# Patient Record
Sex: Male | Born: 1975
Health system: Southern US, Community
[De-identification: ages and names within clinical notes are randomized; demographics above are authoritative.]

---

## 2006-09-10 ENCOUNTER — Ambulatory Visit: Payer: Self-pay | Admitting: Family Medicine

## 2008-10-27 ENCOUNTER — Ambulatory Visit: Payer: Self-pay | Admitting: Family Medicine

## 2008-11-05 ENCOUNTER — Emergency Department (HOSPITAL_COMMUNITY): Admission: EM | Admit: 2008-11-05 | Discharge: 2008-11-05 | Payer: Self-pay | Admitting: Emergency Medicine
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
## 2011-04-07 ENCOUNTER — Ambulatory Visit (INDEPENDENT_AMBULATORY_CARE_PROVIDER_SITE_OTHER): Payer: 59 | Admitting: Medical

## 2011-04-07 ENCOUNTER — Ambulatory Visit: Payer: Self-pay | Admitting: Medical

## 2011-04-07 ENCOUNTER — Encounter: Payer: Self-pay | Admitting: Medical

## 2011-04-07 VITALS — BP 100/80 | HR 60 | Temp 98.2°F | Resp 16 | Wt 180.0 lb

## 2011-04-07 DIAGNOSIS — R05 Cough: Secondary | ICD-10-CM | POA: Insufficient documentation

## 2011-04-07 DIAGNOSIS — R059 Cough, unspecified: Secondary | ICD-10-CM

## 2011-04-07 DIAGNOSIS — R21 Rash and other nonspecific skin eruption: Secondary | ICD-10-CM

## 2011-04-07 MED ORDER — BENZONATATE 200 MG PO CAPS: 200.0000 mg | ORAL_CAPSULE | Freq: Two times a day (BID) | ORAL | Status: AC | PRN

## 2011-04-07 NOTE — Progress Notes (Signed)
Subjective: Here today for 1 wk hx/o nagging cough.  He had a cold, weakness and chills 2 weeks ago, feverish, symptoms improved but then he has his cough has lingered.  He does not feel sick today, mainly just has an ongoing cough.  He is using over-the-counter cough medication without relief.  No other URI symptoms. No recent allergy or reflux symptoms.  He does have a history of mild childhood asthma that resolved.  No other aggravating or relieving factors. No other complaints.  No past medical history on file.  Review of systems: Gen.: No fever, chills, sweats HEENT: No sore throat, ear pain, sinus pressure, runny nose Heart: No chest pain or palpitations Lungs no shortness of breath or wheezing GI: Negative    Objective:   Physical Exam  Filed Vitals:   04/07/11 1031  BP: 100/80  Pulse: 60  Temp: 98.2 F (36.8 C)  Resp: 16    General appearance: alert, no distress, WD/WN, black male  HEENT: normocephalic, sclerae anicteric, TMs pearly, nares patent, no discharge or erythema, pharynx normal Oral cavity: MMM, no lesions Neck: supple, no lymphadenopathy, no thyromegaly, no masses Heart: RRR, normal S1, S2, no murmurs Lungs: CTA bilaterally, no wheezes, rhonchi, or rales Abdomen: +bs, soft, non tender, non distended, no masses, no hepatomegaly, no splenomegaly Pulses: 2+ symmetric, upper and lower extremities, normal cap refill   Assessment and Plan :    Cough - Begin OTC Zyrtec at bedtime 10mg  or Mucinex DM or Robitussin DM OTC if desired. Prescription for Tessalon Perles cough drops you use up to 3 times daily. Drink plenty of water, rest, and if not improving by middle of next week, call back.

## 2011-04-07 NOTE — Patient Instructions (Signed)
Begin OTC Zyrtec at bedtime 10mg  or Mucinex DM or Robitussin DM OTC if desired.   I wrote a prescription for Tessalon Perles cough drops you use up to 3 times daily.   Drink plenty of water, rest, and if not improving by middle of next week, call back.   Cough, Adult  A cough is a reflex that helps clear your throat and airways. It can help heal the body or may be a reaction to an irritated airway. A cough may only last 2 or 3 weeks (acute) or may last more than 8 weeks (chronic).  CAUSES Acute cough:  Viral or bacterial infections.  Chronic cough:  Infections.   Allergies.   Asthma.   Post-nasal drip.   Smoking.   Heartburn or acid reflux.   Some medicines.   Chronic lung problems (COPD).   Cancer.  SYMPTOMS   Cough.   Fever.   Chest pain.   Increased breathing rate.   High-pitched whistling sound when breathing (wheezing).   Colored mucus that you cough up (sputum).  TREATMENT   A bacterial cough may be treated with antibiotic medicine.   A viral cough must run its course and will not respond to antibiotics.   Your caregiver may recommend other treatments if you have a chronic cough.  HOME CARE INSTRUCTIONS   Only take over-the-counter or prescription medicines for pain, discomfort, or fever as directed by your caregiver. Use cough suppressants only as directed by your caregiver.   Use a cold steam vaporizer or humidifier in your bedroom or home to help loosen secretions.   Sleep in a semi-upright position if your cough is worse at night.   Rest as needed.   Stop smoking if you smoke.  SEEK IMMEDIATE MEDICAL CARE IF:   You have pus in your sputum.   Your cough starts to worsen.   You cannot control your cough with suppressants and are losing sleep.   You begin coughing up blood.   You have difficulty breathing.   You develop pain which is getting worse or is uncontrolled with medicine.   You have a fever.  MAKE SURE YOU:   Understand  these instructions.   Will watch your condition.   Will get help right away if you are not doing well or get worse.  Document Released: 09/09/2010 Document Revised: 11/23/2010 Document Reviewed: 09/09/2010 [facility] Patient Information 2012 [place], [place].

## 2011-04-13 ENCOUNTER — Other Ambulatory Visit: Payer: Self-pay | Admitting: Family Medicine

## 2011-09-18 ENCOUNTER — Emergency Department (HOSPITAL_BASED_OUTPATIENT_CLINIC_OR_DEPARTMENT_OTHER)
Admission: EM | Admit: 2011-09-18 | Discharge: 2011-09-18 | Disposition: A | Discharge status: Home / Self Care | Payer: 59 | Attending: Emergency Medicine | Admitting: Emergency Medicine

## 2011-09-18 ENCOUNTER — Encounter (HOSPITAL_BASED_OUTPATIENT_CLINIC_OR_DEPARTMENT_OTHER): Payer: Self-pay | Admitting: Family Medicine

## 2011-09-18 DIAGNOSIS — R197 Diarrhea, unspecified: Secondary | ICD-10-CM | POA: Insufficient documentation

## 2011-09-18 DIAGNOSIS — R002 Palpitations: Secondary | ICD-10-CM | POA: Insufficient documentation

## 2011-09-18 LAB — COMPREHENSIVE METABOLIC PANEL
ALT: 8 U/L (ref 0–53)
AST: 16 U/L (ref 0–37)
Albumin: 4.2 g/dL (ref 3.5–5.2)
CO2: 30 mEq/L (ref 19–32)
Calcium: 10 mg/dL (ref 8.4–10.5)
Creatinine, Ser: 1 mg/dL (ref 0.50–1.35)
GFR calc non Af Amer: >90 mL/min (ref >90)
Sodium: 142 mEq/L (ref 135–145)
Total Protein: 7.7 g/dL (ref 6.0–8.3)

## 2011-09-18 LAB — CBC
MCHC: 35 g/dL (ref 30.0–36.0)
MCV: 87.1 fL (ref 78.0–100.0)
Platelets: 289 10*3/uL (ref 150–400)
RDW: 14.3 % (ref 11.5–15.5)
WBC: 6.1 10*3/uL (ref 4.0–10.5)

## 2011-09-18 LAB — URINALYSIS, ROUTINE W REFLEX MICROSCOPIC
Bilirubin Urine: NEGATIVE
Glucose, UA: NEGATIVE mg/dL
Hgb urine dipstick: NEGATIVE
Specific Gravity, Urine: 1.011 (ref 1.005–1.030)
Urobilinogen, UA: 0.2 mg/dL (ref 0.0–1.0)
pH: 6.5 (ref 5.0–8.0)

## 2011-09-18 LAB — DIFFERENTIAL
Basophils Absolute: 0 10*3/uL (ref 0.0–0.1)
Eosinophils Absolute: 0.1 10*3/uL (ref 0.0–0.7)
Lymphs Abs: 2.5 10*3/uL (ref 0.7–4.0)
Monocytes Relative: 5 % (ref 3–12)
Neutro Abs: 3.2 10*3/uL (ref 1.7–7.7)

## 2011-09-18 MED ORDER — CIPROFLOXACIN HCL 500 MG PO TABS: 500.0000 mg | ORAL_TABLET | Freq: Two times a day (BID) | ORAL | Status: AC

## 2011-09-18 MED ORDER — CIPROFLOXACIN HCL 500 MG PO TABS
500.0000 mg | ORAL_TABLET | Freq: Once | ORAL | Status: AC
  Administered 2011-09-18: 500 mg via ORAL
  Filled 2011-09-18: qty 1

## 2011-09-18 MED ORDER — SODIUM CHLORIDE 0.9 % IV BOLUS (SEPSIS)
1000.0000 mL | Freq: Once | INTRAVENOUS | Status: AC
  Administered 2011-09-18: 1000 mL via INTRAVENOUS

## 2011-09-18 NOTE — Discharge Instructions (Signed)
Continue taking Imodium right ear as needed. Try taking Pepto-Bismol. Just to be aware that Pepto-Bismol turns your stool a grayish black.  Diarrhea Infections caused by germs (bacterial) or a virus commonly cause diarrhea. Your caregiver has determined that with time, rest and fluids, the diarrhea should improve. In general, eat normally while drinking more water than usual. Although water may prevent dehydration, it does not contain salt and minerals (electrolytes). Broths, weak tea without caffeine and oral rehydration solutions (ORS) replace fluids and electrolytes. Small amounts of fluids should be taken frequently. Large amounts at one time may not be tolerated. Plain water may be harmful in infants and the elderly. Oral rehydrating solutions (ORS) are available at pharmacies and grocery stores. ORS replace water and important electrolytes in proper proportions. Sports drinks are not as effective as ORS and may be harmful due to sugars worsening diarrhea.  ORS is especially recommended for use in children with diarrhea. As a general guideline for children, replace any new fluid losses from diarrhea and/or vomiting with ORS as follows:   If your child weighs 22 pounds or under (10 kg or less), give 60-120 mL ( -  cup or 2 - 4 ounces) of ORS for each episode of diarrheal stool or vomiting episode.   If your child weighs more than 22 pounds (more than 10 kgs), give 120-240 mL ( - 1 cup or 4 - 8 ounces) of ORS for each diarrheal stool or episode of vomiting.   While correcting for dehydration, children should eat normally. However, foods high in sugar should be avoided because this may worsen diarrhea. Large amounts of carbonated soft drinks, juice, gelatin desserts and other highly sugared drinks should be avoided.   After correction of dehydration, other liquids that are appealing to the child may be added. Children should drink small amounts of fluids frequently and fluids should be increased  as tolerated. Children should drink enough fluids to keep urine clear or pale yellow.   Adults should eat normally while drinking more fluids than usual. Drink small amounts of fluids frequently and increase as tolerated. Drink enough fluids to keep urine clear or pale yellow. Broths, weak decaffeinated tea, lemon lime soft drinks (allowed to go flat) and ORS replace fluids and electrolytes.   Avoid:   Carbonated drinks.   Juice.   Extremely hot or cold fluids.   Caffeine drinks.   Fatty, greasy foods.   Alcohol.   Tobacco.   Too much intake of anything at one time.   Gelatin desserts.   Probiotics are active cultures of beneficial bacteria. They may lessen the amount and number of diarrheal stools in adults. Probiotics can be found in yogurt with active cultures and in supplements.   Wash hands well to avoid spreading bacteria and virus.   Anti-diarrheal medications are not recommended for infants and children.   Only take over-the-counter or prescription medicines for pain, discomfort or fever as directed by your caregiver. Do not give aspirin to children because it may cause Reye's Syndrome.   For adults, ask your caregiver if you should continue all prescribed and over-the-counter medicines.   If your caregiver has given you a follow-up appointment, it is very important to keep that appointment. Not keeping the appointment could result in a chronic or permanent injury, and disability. If there is any problem keeping the appointment, you must call back to this facility for assistance.  SEEK IMMEDIATE MEDICAL CARE IF:   You or your child is unable to  keep fluids down or other symptoms or problems become worse in spite of treatment.   Vomiting or diarrhea develops and becomes persistent.   There is vomiting of blood or bile (green material).   There is blood in the stool or the stools are black and tarry.   There is no urine output in 6-8 hours or there is only a small  amount of very dark urine.   Abdominal pain develops, increases or localizes.   You have a fever.   Your baby is older than 3 months with a rectal temperature of 102 F (38.9 C) or higher.   Your baby is 48 months old or younger with a rectal temperature of 100.4 F (38 C) or higher.   You or your child develops excessive weakness, dizziness, fainting or extreme thirst.   You or your child develops a rash, stiff neck, severe headache or become irritable or sleepy and difficult to awaken.  MAKE SURE YOU:   Understand these instructions.   Will watch your condition.   Will get help right away if you are not doing well or get worse.  Document Released: 03/03/2002 Document Revised: 03/02/2011 Document Reviewed: 01/18/2009 [facility] Patient Information 2012 [place], [place].  Ciprofloxacin tablets What is this medicine? CIPROFLOXACIN (sip roe FLOX a sin) is a quinolone antibiotic. It is used to treat certain kinds of bacterial infections. It will not work for colds, flu, or other viral infections. This medicine may be used for other purposes; ask your health care provider or pharmacist if you have questions. What should I tell my health care provider before I take this medicine? They need to know if you have any of these conditions: -heart condition -joint problems -kidney disease -liver disease -myasthenia gravis -seizure disorder -an unusual or allergic reaction to ciprofloxacin, other antibiotics or medicines, foods, dyes, or preservatives -pregnant or trying to get pregnant -breast-feeding How should I use this medicine? Take this medicine by mouth with a glass of water. Follow the directions on the prescription label. Take your medicine at regular intervals. Do not take your medicine more often than directed. Take all of your medicine as directed even if you think your are better. Do not skip doses or stop your medicine early. You can take this medicine with food or on an empty  stomach. It can be taken with a meal that contains dairy or calcium, but do not take it alone with a dairy product, like milk or yogurt or calcium-fortified juice. A special MedGuide will be given to you by the pharmacist with each prescription and refill. Be sure to read this information carefully each time. Talk to your pediatrician regarding the use of this medicine in children. Special care may be needed. Overdosage: If you think you have taken too much of this medicine contact a poison control center or emergency room at once. NOTE: This medicine is only for you. Do not share this medicine with others. What if I miss a dose? If you miss a dose, take it as soon as you can. If it is almost time for your next dose, take only that dose. Do not take double or extra doses. What may interact with this medicine? Do not take this medicine with any of the following medications: -cisapride -droperidol -terfenadine -tizanidine This medicine may also interact with the following medications: -antacids -caffeine -cyclosporin -didanosine (ddI) buffered tablets or powder -medicines for diabetes -medicines for inflammation like ibuprofen, naproxen -methotrexate -multivitamins -omeprazole -phenytoin -probenecid -sucralfate -theophylline -warfarin This  list may not describe all possible interactions. Give your health care provider a list of all the medicines, herbs, non-prescription drugs, or dietary supplements you use. Also tell them if you smoke, drink alcohol, or use illegal drugs. Some items may interact with your medicine. What should I watch for while using this medicine? Tell your doctor or health care professional if your symptoms do not improve. Do not treat diarrhea with over the counter products. Contact your doctor if you have diarrhea that lasts more than 2 days or if it is severe and watery. You may get drowsy or dizzy. Do not drive, use machinery, or do anything that needs mental  alertness until you know how this medicine affects you. Do not stand or sit up quickly, especially if you are an older patient. This reduces the risk of dizzy or fainting spells. This medicine can make you more sensitive to the sun. Keep out of the sun. If you cannot avoid being in the sun, wear protective clothing and use sunscreen. Do not use sun lamps or tanning beds/booths. Avoid antacids, aluminum, calcium, iron, magnesium, and zinc products for 6 hours before and 2 hours after taking a dose of this medicine. What side effects may I notice from receiving this medicine? Side effects that you should report to your doctor or health care professional as soon as possible: -allergic reactions like skin rash, itching or hives, swelling of the face, lips, or tongue -breathing problems -confusion, nightmares or hallucinations -feeling faint or lightheaded, falls -irregular heartbeat -joint, muscle or tendon pain or swelling -pain or trouble passing urine -redness, blistering, peeling or loosening of the skin, including inside the mouth -seizure -unusual pain, numbness, tingling, or weakness Side effects that usually do not require medical attention (report to your doctor or health care professional if they continue or are bothersome): -diarrhea -nausea or stomach upset -white patches or sores in the mouth This list may not describe all possible side effects. Call your doctor for medical advice about side effects. You may report side effects to FDA at 1-800-FDA-1088. Where should I keep my medicine? Keep out of the reach of children. Store at room temperature below 30 degrees C (86 degrees F). Keep container tightly closed. Throw away any unused medicine after the expiration date. NOTE: This sheet is a summary. It may not cover all possible information. If you have questions about this medicine, talk to your doctor, pharmacist, or health care provider.  2012, [employer]. (05/31/2009  11:41:11 AM)

## 2011-09-18 NOTE — ED Provider Notes (Addendum)
History   This chart was scribed for Dale Booze, MD by Dale Harper. The patient was seen in room MH01/MH01 and the patient's care was started at 8:20PM.   CSN: [id number]  Arrival date & time 09/18/11  Dale Harper   First MD Initiated Contact with Patient 09/18/11 2022      Chief Complaint  Patient presents with  . Diarrhea  . Palpitations    (Consider location/radiation/quality/duration/timing/severity/associated sxs/prior treatment) HPI Dale Harper is a 36 y.o. male who presents to the Emergency Department complaining of persistent episodes of diarrhea with associated intermittent heart palpitations with an onset 2 weeks ago. 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 Diarrhea x3 today; similar frequency the past 2 weeks. Pt has had to wake up during the night to go to the bathroom. Imodium slightly alleviates the diarrhea. Pertaining to his heart palpitations, pt describes them as his heart beating really fast that is aggrvated when he's laying down, when they are present, timing is 3 minutes. Intermittent right-sided CP also present with a pain severity 6/10 and a timing of a few seconds. Pt also c/o weakness and nausea; at times, pt felt like he was about to passed out; drinking gatorade and water slightly alleviates those s/s. Pt also states that he may have gotten a left foot infection from walking [place]footed in the [place]. No HA, fever, neck pain, sore throat, rash, back pain, SOB, emesis, dysuria, or extremity pain, edema, numbness, or tingling. No known allergies. No other pertinent medical symptoms.  PCP: Dr. Susann Harper  History reviewed. No pertinent past medical history.  History reviewed. No pertinent past surgical history.  No family history on file.  History  Substance Use Topics  . Smoking status: Never Smoker   . Smokeless tobacco: Not on file  . Alcohol Use: No      Review  of Systems 10 Systems reviewed and all are negative for acute change except as noted in the HPI.   Allergies  Review of patient's allergies indicates no known allergies.  Home Medications  No current outpatient prescriptions on file.  BP 130/87  Pulse 61  Temp 97.9 F (36.6 C) (Oral)  Resp 16  SpO2 100%  Physical Exam  Nursing note and vitals reviewed. Constitutional: He is oriented to person, place, and time. He appears well-developed and well-nourished. No distress.  HENT:  Head: Normocephalic and atraumatic.  Right Ear: External ear normal.  Left Ear: External ear normal.  Eyes: EOM are normal.  Neck: Normal range of motion. No tracheal deviation present.  Cardiovascular: Normal rate, regular rhythm and normal heart sounds.   No murmur heard. Pulmonary/Chest: Effort normal and breath sounds normal. No respiratory distress. He has no wheezes.  Abdominal: Soft. Bowel sounds are normal. He exhibits no distension and no mass. There is no tenderness. There is no rebound and no guarding.  Musculoskeletal: Normal range of motion. He exhibits no edema and no tenderness.  Neurological: He is alert and oriented to person, place, and time.  Skin: Skin is warm and dry.  Psychiatric: He has a normal mood and affect. His behavior is normal.    ED Course  Procedures (including critical care time)  DIAGNOSTIC STUDIES: Oxygen Saturation is 100% on room air, normal by my interpretation.    COORDINATION OF CARE:  8:25PM - IV fluids, blood w/u, and UA will be ordered for the pt;  if results are negative, pt will be Rx cipro for general traveler's diarrhea.   Results for orders placed during the hospital encounter of 09/18/11  CBC      Component Value Range   WBC 6.1  4.0 - 10.5 K/uL   RBC 4.82  4.22 - 5.81 MIL/uL   Hemoglobin 14.7  13.0 - 17.0 g/dL   HCT 96.0  45.4 - 09.8 %   MCV 87.1  78.0 - 100.0 fL   MCH 30.5  26.0 - 34.0 pg   MCHC 35.0  30.0 - 36.0 g/dL   RDW 11.9  14.7 -  82.9 %   Platelets 289  150 - 400 K/uL  DIFFERENTIAL      Component Value Range   Neutrophils Relative 53  43 - 77 %   Lymphocytes Relative 41  12 - 46 %   Monocytes Relative 5  3 - 12 %   Eosinophils Relative 1  0 - 5 %   Basophils Relative 0  0 - 1 %   Neutro Abs 3.2  1.7 - 7.7 K/uL   Lymphs Abs 2.5  0.7 - 4.0 K/uL   Monocytes Absolute 0.3  0.1 - 1.0 K/uL   Eosinophils Absolute 0.1  0.0 - 0.7 K/uL   Basophils Absolute 0.0  0.0 - 0.1 K/uL  COMPREHENSIVE METABOLIC PANEL      Component Value Range   Sodium 142  135 - 145 mEq/L   Potassium 3.6  3.5 - 5.1 mEq/L   Chloride 101  96 - 112 mEq/L   CO2 30  19 - 32 mEq/L   Glucose, Bld 90  70 - 99 mg/dL   BUN 6  6 - 23 mg/dL   Creatinine, Ser 5.62  0.50 - 1.35 mg/dL   Calcium 13.0  8.4 - 86.5 mg/dL   Total Protein 7.7  6.0 - 8.3 g/dL   Albumin 4.2  3.5 - 5.2 g/dL   AST 16  0 - 37 U/L   ALT 8  0 - 53 U/L   Alkaline Phosphatase 64  39 - 117 U/L   Total Bilirubin 1.2  0.3 - 1.2 mg/dL   GFR calc non Af Amer >90  >90 mL/min   GFR calc Af Amer >90  >90 mL/min  URINALYSIS, ROUTINE W REFLEX MICROSCOPIC      Component Value Range   Color, Urine YELLOW  YELLOW   APPearance CLEAR  CLEAR   Specific Gravity, Urine 1.011  1.005 - 1.030   pH 6.5  5.0 - 8.0   Glucose, UA NEGATIVE  NEGATIVE mg/dL   Hgb urine dipstick NEGATIVE  NEGATIVE   Bilirubin Urine NEGATIVE  NEGATIVE   Ketones, ur NEGATIVE  NEGATIVE mg/dL   Protein, ur NEGATIVE  NEGATIVE mg/dL   Urobilinogen, UA 0.2  0.0 - 1.0 mg/dL   Nitrite NEGATIVE  NEGATIVE   Leukocytes, UA NEGATIVE  NEGATIVE   ECG shows normal sinus rhythm with a rate of 61, no ectopy. Normal axis. Normal P wave. Normal QRS. Normal intervals. Normal ST and T waves. Ipression: normal ECG. No prior ECG available for comparison.   1. Diarrhea       MDM  Diarrhea which has been persistent. Because of recent travel, need to consider possible infectious etiologies. Orthostatic vital signs showed no significant  change as there does not appear to be dehydrated. Laboratory workup is unremarkable as well. He will be given empiric treatment with ciprofloxacin twice a day for 3 days. If diarrhea  persists, stool culture and stool for ova and parasite we'll need to be checked.  I personally performed the services described in this documentation, which was scribed in my presence. The recorded information has been reviewed and considered.          Dale Booze, MD 09/18/11 1914  Dale Booze, MD 09/18/11 2224

## 2011-09-18 NOTE — ED Notes (Signed)
Pt c/o diarrhea and feeling like heart is beating fast x 2 wks. 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 Pt denies pain, shob, fever or diarrhea.

## 2011-09-18 NOTE — ED Notes (Signed)
MD at bedside. 

## 2012-05-27 ENCOUNTER — Encounter (HOSPITAL_COMMUNITY): Payer: Self-pay

## 2012-05-27 ENCOUNTER — Emergency Department (HOSPITAL_COMMUNITY)
Admission: EM | Admit: 2012-05-27 | Discharge: 2012-05-27 | Disposition: A | Discharge status: Home / Self Care | Payer: 59 | Source: Home / Self Care

## 2012-05-27 DIAGNOSIS — E860 Dehydration: Secondary | ICD-10-CM

## 2012-05-27 DIAGNOSIS — R112 Nausea with vomiting, unspecified: Secondary | ICD-10-CM

## 2012-05-27 DIAGNOSIS — K529 Noninfective gastroenteritis and colitis, unspecified: Secondary | ICD-10-CM

## 2012-05-27 LAB — POCT I-STAT, CHEM 8
Chloride: 103 mEq/L (ref 96–112)
Creatinine, Ser: 1.2 mg/dL (ref 0.50–1.35)
Glucose, Bld: 119 mg/dL — ABNORMAL HIGH (ref 70–99)
Hemoglobin: 18 g/dL — ABNORMAL HIGH (ref 13.0–17.0)
Potassium: 3.8 mEq/L (ref 3.5–5.1)
Sodium: 140 mEq/L (ref 135–145)

## 2012-05-27 MED ORDER — ACETAMINOPHEN 500 MG PO TABS
1000.0000 mg | ORAL_TABLET | Freq: Once | ORAL | Status: AC
  Administered 2012-05-27: 1000 mg via ORAL

## 2012-05-27 MED ORDER — SODIUM CHLORIDE 0.9 % IV SOLN
Freq: Once | INTRAVENOUS | Status: AC
  Administered 2012-05-27: 17:00:00 via INTRAVENOUS

## 2012-05-27 MED ORDER — ONDANSETRON HCL 4 MG/2ML IJ SOLN
INTRAMUSCULAR | Status: AC
  Filled 2012-05-27: qty 2

## 2012-05-27 MED ORDER — ONDANSETRON HCL 4 MG PO TABS: 4.0000 mg | ORAL_TABLET | Freq: Four times a day (QID) | ORAL | Status: DC

## 2012-05-27 MED ORDER — ACETAMINOPHEN 325 MG PO TABS
ORAL_TABLET | ORAL | Status: AC
  Filled 2012-05-27: qty 3

## 2012-05-27 MED ORDER — ONDANSETRON HCL 4 MG/2ML IJ SOLN
4.0000 mg | Freq: Once | INTRAMUSCULAR | Status: AC
  Administered 2012-05-27: 4 mg via INTRAVENOUS

## 2012-05-27 NOTE — ED Notes (Signed)
Pt states his nausea improved, tylenol 1 gm given PO

## 2012-05-27 NOTE — ED Notes (Signed)
Per wife "he has to be real sick to go to the Md office" no one else at home ill

## 2012-05-27 NOTE — ED Provider Notes (Signed)
History     CSN: [id number]  Arrival date & time 05/27/12  1338   First MD Initiated Contact with Patient 05/27/12 1508      Chief Complaint  Patient presents with  . Abdominal Pain    (Consider location/radiation/quality/duration/timing/severity/associated sxs/prior treatment) HPI Comments: 37 year old male awoke this morning with a cough. Shortly after the development of a cough he started vomiting. Diarrhea then followed and he persisted with vomiting and diarrhea too numerous to count throughout the day. He is complaining of extreme weakness. He has myalgias particularly in the back. He is drinking water and Gatorade but is unable to hold it down. This produces more vomiting and diarrhea. He currently has a temperature of 102.3. He complains of abdominal cramping. The history is provided by the patient and a parent.    History reviewed. No pertinent past medical history.  History reviewed. No pertinent past surgical history.  No family history on file.  History  Substance Use Topics  . Smoking status: Never Smoker   . Smokeless tobacco: Not on file  . Alcohol Use: No      Review of Systems  Constitutional: Positive for fever, activity change, appetite change and fatigue.  HENT: Negative.   Respiratory: Negative.   Cardiovascular: Negative.   Gastrointestinal: Positive for nausea, vomiting, abdominal pain and diarrhea. Negative for blood in stool, abdominal distention and anal bleeding.  Genitourinary: Negative.   Musculoskeletal: Positive for myalgias.  Skin: Negative.   Psychiatric/Behavioral: Negative.     Allergies  Review of patient's allergies indicates no known allergies.  Home Medications  No current outpatient prescriptions on file.  BP 118/75  Pulse 118  Temp(Src) 102.3 F (39.1 C) (Oral)  Resp 22  SpO2 98%  Physical Exam  Nursing note and vitals reviewed. Constitutional: He is oriented to person, place, and time. He appears well-developed and  well-nourished. No distress.  HENT:  Head: Normocephalic and atraumatic.  Mouth/Throat: Oropharynx is clear and moist. No oropharyngeal exudate.  Eyes: Conjunctivae and EOM are normal. Pupils are equal, round, and reactive to light.  Neck: Normal range of motion. Neck supple.  Cardiovascular: Normal rate, regular rhythm and normal heart sounds.   Pulmonary/Chest: Effort normal and breath sounds normal. No respiratory distress. He has no wheezes. He has no rales.  Abdominal: Soft. Bowel sounds are normal. He exhibits no distension and no mass. There is no rebound and no guarding.  Abdominal exam is remarkable for nausea when palpating the epigastrium. No other tenderness is produced.  Musculoskeletal: Normal range of motion. He exhibits no tenderness.  Lymphadenopathy:    He has no cervical adenopathy.  Neurological: He is alert and oriented to person, place, and time. No cranial nerve deficit.  Skin: Skin is warm and dry. No rash noted.  Psychiatric: He has a normal mood and affect.    ED Course  Procedures (including critical care time)  Labs Reviewed  POCT I-STAT, CHEM 8 - Abnormal; Notable for the following:    Glucose, Bld 119 (*)    Hemoglobin 18.0 (*)    HCT 53.0 (*)    All other components within normal limits   No results found.  Results for orders placed during the hospital encounter of 05/27/12  POCT I-STAT, CHEM 8      Result Value Range   Sodium 140  135 - 145 mEq/L   Potassium 3.8  3.5 - 5.1 mEq/L   Chloride 103  96 - 112 mEq/L   BUN 16  6 -  23 mg/dL   Creatinine, Ser 1.61  0.50 - 1.35 mg/dL   Glucose, Bld 096 (*) 70 - 99 mg/dL   Calcium, Ion 0.45  4.09 - 1.23 mmol/L   TCO2 25  0 - 100 mmol/L   Hemoglobin 18.0 (*) 13.0 - 17.0 g/dL   HCT 81.1 (*) 91.4 - 78.2 %      1. Gastroenteritis presumed infectious   2. Dehydration   3. Nausea and vomiting       MDM  The patient received 1 L of normal saline IV and Zofran 4 mg IV. For fever 1 g of acetaminophen.  Time of discharge she states she is feeling much better and ready to go home. He is not having abdominal pain or diarrhea at this time. He also states it is nausea has significantly improved. Clear liquids for the next 24 hours and slowly dances discussed. Written diet also given. Prescription for Zofran 4 mg by mouth every 6 hours when necessary nausea or vomiting Recommend not taking anything for diarrhea which having had 4 stools today. He may take Imodium right ear one tablet every 6-8 hours just to slow down. Do not take sufficient amount to stop her diarrhea.         Hayden Rasmussen, NP 05/27/12 1826

## 2012-05-27 NOTE — ED Notes (Signed)
Attempted IV start x 2 LIFA

## 2012-05-29 NOTE — ED Provider Notes (Signed)
Medical screening examination/treatment/procedure(s) were performed by resident physician or non-physician practitioner and as supervising physician I was immediately available for consultation/collaboration.   KINDL,JAMES DOUGLAS MD.   James D Kindl, MD 05/29/12 1823 

## 2014-04-09 ENCOUNTER — Emergency Department (HOSPITAL_COMMUNITY)
Admission: EM | Admit: 2014-04-09 | Discharge: 2014-04-09 | Disposition: A | Discharge status: Home / Self Care | Payer: 59 | Source: Home / Self Care | Attending: Family Medicine | Admitting: Family Medicine

## 2014-04-09 ENCOUNTER — Encounter (HOSPITAL_COMMUNITY): Payer: Self-pay | Admitting: Emergency Medicine

## 2014-04-09 DIAGNOSIS — R05 Cough: Secondary | ICD-10-CM

## 2014-04-09 DIAGNOSIS — R059 Cough, unspecified: Secondary | ICD-10-CM

## 2014-04-09 DIAGNOSIS — J40 Bronchitis, not specified as acute or chronic: Secondary | ICD-10-CM

## 2014-04-09 MED ORDER — BENZONATATE 200 MG PO CAPS: 200.0000 mg | ORAL_CAPSULE | Freq: Three times a day (TID) | ORAL | Status: DC | PRN

## 2014-04-09 MED ORDER — HYDROCOD POLST-CHLORPHEN POLST 10-8 MG/5ML PO LQCR
5.0000 mL | Freq: Every evening | ORAL | Status: DC | PRN
Start: 2014-04-09 — End: 2020-10-29

## 2014-04-09 NOTE — ED Provider Notes (Signed)
CSN: [id number]     Arrival date & time 04/09/14  4098 History   First MD Initiated Contact with Patient 04/09/14 [phone]     Chief Complaint  Patient presents with  . Cough   (Consider location/radiation/quality/duration/timing/severity/associated sxs/prior Treatment) HPI        39 year old male presents for evaluation of cough. He has had a dry cough for 2 weeks. Initially he had a sore throat and body aches but those have gone away. The cough is dry, nonproductive, worse at night. No recent travel. He has multiple sick contacts with similar illness. No chest pain or shortness of breath.   History reviewed. No pertinent past medical history. History reviewed. No pertinent past surgical history. History reviewed. No pertinent family history. History  Substance Use Topics  . Smoking status: Never Smoker   . Smokeless tobacco: Not on file  . Alcohol Use: No    Review of Systems  Constitutional: Negative for fever and chills.  HENT: Positive for sore throat. Negative for congestion and sinus pressure.   Respiratory: Positive for cough. Negative for shortness of breath and wheezing.   Cardiovascular: Negative for chest pain.  All other systems reviewed and are negative.   Allergies  Review of patient's allergies indicates no known allergies.  Home Medications   Prior to Admission medications   Medication Sig Start Date End Date Taking? Authorizing Provider  benzonatate (TESSALON) 200 MG capsule Take 1 capsule (200 mg total) by mouth 3 (three) times daily as needed for cough. 04/09/14   Graylon Good, PA-C  chlorpheniramine-HYDROcodone (TUSSIONEX PENNKINETIC ER) 10-8 MG/5ML LQCR Take 5 mLs by mouth at bedtime as needed for cough. 04/09/14   Adrian Blackwater Gustavo Meditz, PA-C  ondansetron (ZOFRAN) 4 MG tablet Take 1 tablet (4 mg total) by mouth every 6 (six) hours. Prn nausea or vomiting 05/27/12   Hayden Rasmussen, NP   BP 125/82 mmHg  Pulse 79  Temp(Src) 98.2 F (36.8 C) (Oral)  Resp 20  SpO2  96% Physical Exam  Constitutional: He is oriented to person, place, and time. He appears well-developed and well-nourished. No distress.  HENT:  Head: Normocephalic and atraumatic.  Right Ear: External ear normal.  Left Ear: External ear normal.  Nose: Nose normal.  Mouth/Throat: Oropharynx is clear and moist. No oropharyngeal exudate.  Eyes: Conjunctivae are normal. Right eye exhibits no discharge. Left eye exhibits no discharge.  Neck: Normal range of motion. Neck supple.  Cardiovascular: Normal rate, regular rhythm and normal heart sounds.   Pulmonary/Chest: Effort normal and breath sounds normal. No respiratory distress.  Lymphadenopathy:    He has no cervical adenopathy.  Neurological: He is alert and oriented to person, place, and time. Coordination normal.  Skin: Skin is warm and dry. No rash noted. He is not diaphoretic.  Psychiatric: He has a normal mood and affect. Judgment normal.  Nursing note and vitals reviewed.   ED Course  Procedures (including critical care time) Labs Review Labs Reviewed - No data to display  Imaging Review No results found.   MDM   1. Bronchitis   2. Cough    Discussed with him the course of bronchitis. Will treat with cough suppressants. Return precautions have been discussed with the patient, follow-up when necessary   Meds ordered this encounter  Medications  . chlorpheniramine-HYDROcodone (TUSSIONEX PENNKINETIC ER) 10-8 MG/5ML LQCR    Sig: Take 5 mLs by mouth at bedtime as needed for cough.    Dispense:  115 mL    Refill:  0    Order Specific Question:  Supervising Provider    Answer:  Linna HoffKINDL, JAMES D [[id number]]  . benzonatate (TESSALON) 200 MG capsule    Sig: Take 1 capsule (200 mg total) by mouth 3 (three) times daily as needed for cough.    Dispense:  20 capsule    Refill:  0    Order Specific Question:  Supervising Provider    Answer:  Bradd CanaryKINDL, JAMES D [[id number]]       Graylon GoodZachary H Arlicia Paquette, PA-C 04/09/14 1137

## 2014-04-09 NOTE — Discharge Instructions (Signed)
Upper Respiratory Infection, Adult °An upper respiratory infection (URI) is also sometimes known as the common cold. The upper respiratory tract includes the nose, sinuses, throat, trachea, and bronchi. Bronchi are the airways leading to the lungs. Most people improve within 1 week, but symptoms can last up to 2 weeks. A residual cough may last even longer.  °CAUSES °Many different viruses can infect the tissues lining the upper respiratory tract. The tissues become irritated and inflamed and often become very moist. Mucus production is also common. A cold is contagious. You can easily spread the virus to others by oral contact. This includes kissing, sharing a glass, coughing, or sneezing. Touching your mouth or nose and then touching a surface, which is then touched by another person, can also spread the virus. °SYMPTOMS  °Symptoms typically develop 1 to 3 days after you come in contact with a cold virus. Symptoms vary from person to person. They may include: °· Runny nose. °· Sneezing. °· Nasal congestion. °· Sinus irritation. °· Sore throat. °· Loss of voice (laryngitis). °· Cough. °· Fatigue. °· Muscle aches. °· Loss of appetite. °· Headache. °· Low-grade fever. °DIAGNOSIS  °You might diagnose your own cold based on familiar symptoms, since most people get a cold 2 to 3 times a year. Your caregiver can confirm this based on your exam. Most importantly, your caregiver can check that your symptoms are not due to another disease such as strep throat, sinusitis, pneumonia, asthma, or epiglottitis. Blood tests, throat tests, and X-rays are not necessary to diagnose a common cold, but they may sometimes be helpful in excluding other more serious diseases. Your caregiver will decide if any further tests are required. °RISKS AND COMPLICATIONS  °You may be at risk for a more severe case of the common cold if you smoke cigarettes, have chronic heart disease (such as heart failure) or lung disease (such as asthma), or if  you have a weakened immune system. The very young and very old are also at risk for more serious infections. Bacterial sinusitis, middle ear infections, and bacterial pneumonia can complicate the common cold. The common cold can worsen asthma and chronic obstructive pulmonary disease (COPD). Sometimes, these complications can require emergency medical care and may be life-threatening. °PREVENTION  °The best way to protect against getting a cold is to practice good hygiene. Avoid oral or hand contact with people with cold symptoms. Wash your hands often if contact occurs. There is no clear evidence that vitamin C, vitamin E, echinacea, or exercise reduces the chance of developing a cold. However, it is always recommended to get plenty of rest and practice good nutrition. °TREATMENT  °Treatment is directed at relieving symptoms. There is no cure. Antibiotics are not effective, because the infection is caused by a virus, not by bacteria. Treatment may include: °· Increased fluid intake. Sports drinks offer valuable electrolytes, sugars, and fluids. °· Breathing heated mist or steam (vaporizer or shower). °· Eating chicken soup or other clear broths, and maintaining good nutrition. °· Getting plenty of rest. °· Using gargles or lozenges for comfort. °· Controlling fevers with ibuprofen or acetaminophen as directed by your caregiver. °· Increasing usage of your inhaler if you have asthma. °Zinc gel and zinc lozenges, taken in the first 24 hours of the common cold, can shorten the duration and lessen the severity of symptoms. Pain medicines may help with fever, muscle aches, and throat pain. A variety of non-prescription medicines are available to treat congestion and runny nose. Your caregiver   can make recommendations and may suggest nasal or lung inhalers for other symptoms.  °HOME CARE INSTRUCTIONS  °· Only take over-the-counter or prescription medicines for pain, discomfort, or fever as directed by your  caregiver. °· Use a warm mist humidifier or inhale steam from a shower to increase air moisture. This may keep secretions moist and make it easier to breathe. °· Drink enough water and fluids to keep your urine clear or pale yellow. °· Rest as needed. °· Return to work when your temperature has returned to normal or as your caregiver advises. You may need to stay home longer to avoid infecting others. You can also use a face mask and careful hand washing to prevent spread of the virus. °SEEK MEDICAL CARE IF:  °· After the first few days, you feel you are getting worse rather than better. °· You need your caregiver's advice about medicines to control symptoms. °· You develop chills, worsening shortness of breath, or brown or red sputum. These may be signs of pneumonia. °· You develop yellow or brown nasal discharge or pain in the face, especially when you bend forward. These may be signs of sinusitis. °· You develop a fever, swollen neck glands, pain with swallowing, or white areas in the back of your throat. These may be signs of strep throat. °SEEK IMMEDIATE MEDICAL CARE IF:  °· You have a fever. °· You develop severe or persistent headache, ear pain, sinus pain, or chest pain. °· You develop wheezing, a prolonged cough, cough up blood, or have a change in your usual mucus (if you have chronic lung disease). °· You develop sore muscles or a stiff neck. °Document Released: 09/06/2000 Document Revised: 06/05/2011 Document Reviewed: 06/18/2013 °[employer]® Patient Information ©2015 [employer]. This information is not intended to replace advice given to you by your health care provider. Make sure you discuss any questions you have with your health care provider. ° °Cough, Adult ° A cough is a reflex that helps clear your throat and airways. It can help heal the body or may be a reaction to an irritated airway. A cough may only last 2 or 3 weeks (acute) or may last more than 8 weeks (chronic).  °CAUSES °Acute  cough: °· Viral or bacterial infections. °Chronic cough: °· Infections. °· Allergies. °· Asthma. °· Post-nasal drip. °· Smoking. °· Heartburn or acid reflux. °· Some medicines. °· Chronic lung problems (COPD). °· Cancer. °SYMPTOMS  °· Cough. °· Fever. °· Chest pain. °· Increased breathing rate. °· High-pitched whistling sound when breathing (wheezing). °· Colored mucus that you cough up (sputum). °TREATMENT  °· A bacterial cough may be treated with antibiotic medicine. °· A viral cough must run its course and will not respond to antibiotics. °· Your caregiver may recommend other treatments if you have a chronic cough. °HOME CARE INSTRUCTIONS  °· Only take over-the-counter or prescription medicines for pain, discomfort, or fever as directed by your caregiver. Use cough suppressants only as directed by your caregiver. °· Use a cold steam vaporizer or humidifier in your bedroom or home to help loosen secretions. °· Sleep in a semi-upright position if your cough is worse at night. °· Rest as needed. °· Stop smoking if you smoke. °SEEK IMMEDIATE MEDICAL CARE IF:  °· You have pus in your sputum. °· Your cough starts to worsen. °· You cannot control your cough with suppressants and are losing sleep. °· You begin coughing up blood. °· You have difficulty breathing. °· You develop pain which is   getting worse or is uncontrolled with medicine. °· You have a fever. °MAKE SURE YOU:  °· Understand these instructions. °· Will watch your condition. °· Will get help right away if you are not doing well or get worse. °Document Released: 09/09/2010 Document Revised: 06/05/2011 Document Reviewed: 09/09/2010 °ExitCare® Patient Information ©2015 [employer]. This information is not intended to replace advice given to you by your health care provider. Make sure you discuss any questions you have with your health care provider. ° °

## 2014-04-09 NOTE — ED Notes (Signed)
Pt states that he has had a cough for 2 weeks with no relief pt denies any other symptom

## 2017-09-20 ENCOUNTER — Ambulatory Visit (INDEPENDENT_AMBULATORY_CARE_PROVIDER_SITE_OTHER): Payer: Self-pay | Admitting: Family Medicine

## 2017-09-20 VITALS — BP 120/88 | HR 74 | Temp 98.6°F | Resp 16 | Ht 71.0 in | Wt 208.2 lb

## 2017-09-20 DIAGNOSIS — Z0000 Encounter for general adult medical examination without abnormal findings: Secondary | ICD-10-CM

## 2017-09-20 NOTE — Patient Instructions (Signed)

## 2017-09-20 NOTE — Progress Notes (Signed)
Dale Harper is a 42 y.o. male who presents today with concerns of the need for a physical exam. He does not have a current PCP and denies having an eye exam or dental exam in the last 10 years. He denies any chronic or acute health conditions.  Review of Systems  Constitutional: Negative for chills, fever and malaise/fatigue.  HENT: Negative for congestion, ear discharge, ear pain, sinus pain and sore throat.   Eyes: Negative.   Respiratory: Negative for cough, sputum production and shortness of breath.   Cardiovascular: Negative.  Negative for chest pain.  Gastrointestinal: Negative for abdominal pain, diarrhea, nausea and vomiting.  Genitourinary: Negative for dysuria, frequency, hematuria and urgency.  Musculoskeletal: Negative for myalgias.  Skin: Negative.   Neurological: Negative for headaches.  Endo/Heme/Allergies: Negative.   Psychiatric/Behavioral: Negative.     O: Vitals:   09/20/17 0932  BP: 120/88  Pulse: 74  Resp: 16  Temp: 98.6 F (37 C)  SpO2: 96%     Physical Exam  Constitutional: He is oriented to person, place, and time. Vital signs are normal. He appears well-developed and well-nourished. He is active.  Non-toxic appearance. He does not have a sickly appearance.  HENT:  Head: Normocephalic.  Right Ear: Hearing, tympanic membrane, external ear and ear canal normal.  Left Ear: Hearing, tympanic membrane, external ear and ear canal normal.  Nose: Nose normal.  Mouth/Throat: Uvula is midline and oropharynx is clear and moist.  Neck: Normal range of motion. Neck supple.  Cardiovascular: Normal rate, regular rhythm, normal heart sounds and normal pulses.  Pulmonary/Chest: Effort normal and breath sounds normal.  Abdominal: Soft. Bowel sounds are normal.  Musculoskeletal: Normal range of motion.  Lymphadenopathy:       Head (right side): No submental and no submandibular adenopathy present.       Head (left side): No submental and no submandibular  adenopathy present.    He has no cervical adenopathy.  Neurological: He is alert and oriented to person, place, and time.  Psychiatric: He has a normal mood and affect.  Vitals reviewed.  A: 1. Physical exam    P: Exam findings, diagnosis etiology and medication use and indications reviewed with patient. Follow- Up and discharge instructions provided. No emergent/urgent issues found on exam.  Patient verbalized understanding of information provided and agrees with plan of care (POC), all questions answered.  1. Physical exam WNL- advised the importance of a PCP and given information to obtain one.

## 2019-03-14 ENCOUNTER — Other Ambulatory Visit: Payer: Self-pay

## 2019-03-14 ENCOUNTER — Ambulatory Visit: Payer: 59 | Attending: Internal Medicine

## 2019-03-14 DIAGNOSIS — Z20822 Contact with and (suspected) exposure to covid-19: Secondary | ICD-10-CM

## 2019-03-14 DIAGNOSIS — Z20828 Contact with and (suspected) exposure to other viral communicable diseases: Secondary | ICD-10-CM | POA: Diagnosis not present

## 2019-03-15 LAB — NOVEL CORONAVIRUS, NAA: SARS-CoV-2, NAA: NOT DETECTED

## 2019-05-01 ENCOUNTER — Ambulatory Visit: Payer: 59 | Attending: Internal Medicine

## 2019-05-01 DIAGNOSIS — Z20822 Contact with and (suspected) exposure to covid-19: Secondary | ICD-10-CM | POA: Diagnosis not present

## 2019-05-02 LAB — NOVEL CORONAVIRUS, NAA: SARS-CoV-2, NAA: NOT DETECTED

## 2019-08-22 ENCOUNTER — Ambulatory Visit: Payer: 59 | Attending: Internal Medicine

## 2019-08-22 DIAGNOSIS — Z20822 Contact with and (suspected) exposure to covid-19: Secondary | ICD-10-CM

## 2019-08-23 LAB — NOVEL CORONAVIRUS, NAA: SARS-CoV-2, NAA: NOT DETECTED

## 2019-08-23 LAB — SARS-COV-2, NAA 2 DAY TAT

## 2019-11-10 ENCOUNTER — Other Ambulatory Visit: Payer: 59

## 2019-11-10 ENCOUNTER — Other Ambulatory Visit: Payer: Self-pay

## 2019-11-10 DIAGNOSIS — Z20822 Contact with and (suspected) exposure to covid-19: Secondary | ICD-10-CM | POA: Diagnosis not present

## 2019-11-11 LAB — SARS-COV-2, NAA 2 DAY TAT

## 2019-11-11 LAB — NOVEL CORONAVIRUS, NAA: SARS-CoV-2, NAA: NOT DETECTED

## 2020-04-02 ENCOUNTER — Other Ambulatory Visit: Payer: 59

## 2020-04-02 DIAGNOSIS — Z20822 Contact with and (suspected) exposure to covid-19: Secondary | ICD-10-CM

## 2020-04-06 LAB — NOVEL CORONAVIRUS, NAA: SARS-CoV-2, NAA: NOT DETECTED

## 2020-10-29 ENCOUNTER — Ambulatory Visit: Payer: 59 | Admitting: Medical

## 2020-10-29 ENCOUNTER — Encounter: Payer: Self-pay | Admitting: Medical

## 2020-10-29 ENCOUNTER — Other Ambulatory Visit: Payer: Self-pay

## 2020-10-29 VITALS — BP 125/78 | HR 71 | Temp 98.6°F | Resp 18 | Ht 71.0 in | Wt 221.4 lb

## 2020-10-29 DIAGNOSIS — Z0000 Encounter for general adult medical examination without abnormal findings: Secondary | ICD-10-CM | POA: Diagnosis not present

## 2020-10-29 DIAGNOSIS — R059 Cough, unspecified: Secondary | ICD-10-CM | POA: Diagnosis not present

## 2020-10-29 DIAGNOSIS — Z23 Encounter for immunization: Secondary | ICD-10-CM | POA: Diagnosis not present

## 2020-10-29 DIAGNOSIS — Z113 Encounter for screening for infections with a predominantly sexual mode of transmission: Secondary | ICD-10-CM

## 2020-10-29 LAB — CBC WITH DIFFERENTIAL/PLATELET
Basophils Absolute: 0 10*3/uL (ref 0.0–0.1)
Basophils Relative: 0.1 % (ref 0.0–3.0)
Eosinophils Absolute: 0.1 10*3/uL (ref 0.0–0.7)
Eosinophils Relative: 1.9 % (ref 0.0–5.0)
HCT: 44.6 % (ref 39.0–52.0)
Hemoglobin: 15 g/dL (ref 13.0–17.0)
Lymphocytes Relative: 37.8 % (ref 12.0–46.0)
Lymphs Abs: 2.2 10*3/uL (ref 0.7–4.0)
MCHC: 33.6 g/dL (ref 30.0–36.0)
MCV: 90 fl (ref 78.0–100.0)
Monocytes Absolute: 0.4 10*3/uL (ref 0.1–1.0)
Monocytes Relative: 7.1 % (ref 3.0–12.0)
Neutro Abs: 3.1 10*3/uL (ref 1.4–7.7)
Neutrophils Relative %: 53.1 % (ref 43.0–77.0)
Platelets: 255 10*3/uL (ref 150.0–400.0)
RBC: 4.96 Mil/uL (ref 4.22–5.81)
RDW: 14.2 % (ref 11.5–15.5)
WBC: 5.9 10*3/uL (ref 4.0–10.5)

## 2020-10-29 LAB — COMPREHENSIVE METABOLIC PANEL
ALT: 24 U/L (ref 0–53)
AST: 21 U/L (ref 0–37)
Albumin: 4.3 g/dL (ref 3.5–5.2)
Alkaline Phosphatase: 57 U/L (ref 39–117)
BUN: 10 mg/dL (ref 6–23)
CO2: 33 mEq/L — ABNORMAL HIGH (ref 19–32)
Calcium: 9.7 mg/dL (ref 8.4–10.5)
Chloride: 100 mEq/L (ref 96–112)
Creatinine, Ser: 1.13 mg/dL (ref 0.40–1.50)
GFR: 79.52 mL/min (ref >60.00)
Glucose, Bld: 92 mg/dL (ref 70–99)
Potassium: 4.2 mEq/L (ref 3.5–5.1)
Sodium: 139 mEq/L (ref 135–145)
Total Bilirubin: 1.8 mg/dL — ABNORMAL HIGH (ref 0.2–1.2)
Total Protein: 7 g/dL (ref 6.0–8.3)

## 2020-10-29 LAB — LIPID PANEL
Cholesterol: 123 mg/dL (ref 0–200)
HDL: 36.8 mg/dL — ABNORMAL LOW (ref >39.00)
LDL Cholesterol: 67 mg/dL (ref 0–99)
NonHDL: 86.24
Total CHOL/HDL Ratio: 3
Triglycerides: 96 mg/dL (ref 0.0–149.0)
VLDL: 19.2 mg/dL (ref 0.0–40.0)

## 2020-10-29 MED ORDER — FAMOTIDINE 20 MG PO TABS: 20.0000 mg | ORAL_TABLET | Freq: Every day | ORAL | 0 refills | Status: AC

## 2020-10-29 NOTE — Progress Notes (Signed)
Subjective:    Patient ID: Dale Harper, male    DOB: 03-12-1976, 45 y.o.   MRN: [id number]  HPI  Pt in for first time. Will do wellness exam today.  Pt works for [employer]. No regular exercise. States moderate healthy diet. Non smoker. Rare alcohol use 1 glass of wine every 6 months. Rare soda. Pt is going thru divorce.  No major medical problems per pt.   Pt reports phizer covid vaccine x 2.   No recent documented tetanus.  Pt states for few months when he eats he will get dry cough. No belching. No burning sensation after he eats. Sometimes when lays down on back will get dry cough. He also notes 2 months ago choked on tootsie roll. Describe tootsie roll felt like it got stuck. Eventually after 30 seconds he swallowed.  Bp initially high today. He states no history of htn. On recheck in future.   Review of Systems  Constitutional:  Negative for chills, fatigue and fever.  HENT:  Negative for congestion.   Respiratory:  Positive for cough. Negative for choking, shortness of breath and wheezing.        See hpi.  Gastrointestinal:  Negative for abdominal distention, abdominal pain, blood in stool and diarrhea.  Genitourinary:  Negative for dysuria.  Musculoskeletal:  Negative for back pain, joint swelling and neck stiffness.  Skin:  Negative for rash.  Neurological:  Negative for dizziness, speech difficulty, weakness, numbness and headaches.  Hematological:  Negative for adenopathy. Does not bruise/bleed easily.  Psychiatric/Behavioral:  Negative for behavioral problems, decreased concentration, dysphoric mood and sleep disturbance. The patient is not nervous/anxious.     History reviewed. No pertinent past medical history.   Social History   Socioeconomic History   Marital status: Married    Spouse name: Not on file   Number of children: Not on file   Years of education: Not on file   Highest education level: Not on file   Occupational History   Not on file  Tobacco Use   Smoking status: Never   Smokeless tobacco: Never  Vaping Use   Vaping Use: Never used  Substance and Sexual Activity   Alcohol use: Yes    Alcohol/week: 1.0 standard drink    Types: 1 Glasses of wine per week    Comment: every 6 monhts.   Drug use: No   Sexual activity: Not on file  Other Topics Concern   Not on file  Social History Narrative   Not on file   Social Determinants of Health   Financial Resource Strain: Not on file  Food Insecurity: Not on file  Transportation Needs: Not on file  Physical Activity: Not on file  Stress: Not on file  Social Connections: Not on file  Intimate Partner Violence: Not on file    History reviewed. No pertinent surgical history.  History reviewed. No pertinent family history.  No Known Allergies  Current Outpatient Medications on File Prior to Visit  Medication Sig Dispense Refill   benzonatate (TESSALON) 200 MG capsule Take 1 capsule (200 mg total) by mouth 3 (three) times daily as needed for cough. 20 capsule 0   No current facility-administered medications on file prior to visit.    BP (!) 142/102   Pulse 71   Temp 98.6 F (37 C)   Resp 18   Ht 5\' 11"  (1.803 m)   Wt 221 lb 6.4 oz (100.4 kg)   SpO2 98%  BMI 30.88 kg/m        Objective:   Physical Exam  General Mental Status- Alert. General Appearance- Not in acute distress.   Skin General: Color- Normal Color. Moisture- Normal Moisture.  Neck  From, no nuccal rigidity. No lymphendopathy. No tracheal deviation.  Chest and Lung Exam Auscultation: Breath Sounds:-Normal.  Cardiovascular Auscultation:Rythm- Regular. Murmurs & Other Heart Sounds:Auscultation of the heart reveals- No Murmurs.  Abdomen Inspection:-Inspeection Normal. Palpation/Percussion:Note:No mass. Palpation and Percussion of the abdomen reveal- Non Tender, Non Distended + BS, no rebound or guarding.    Neurologic Cranial Nerve  exam:- CN III-XII intact(No nystagmus), symmetric smile. Strength:- 5/5 equal and symmetric strength both upper and lower extremities.   Lower ext- calfs symmetric, no pedal edema. Negative homans signs.     Assessment & Plan:   For you wellness exam today I have ordered cbc, cmp, tsh,  hiv and lipid panel.  Tdap today.   Recommend exercise and healthy diet.  We will let you know lab results as they come in.  Follow up date appointment will be determined after lab review.    For cough recently after eating recommend strict healthy diet and will prescribe famotadine.  If cough persists despite diet and famotadine then get chest xray.   Bp better on recheck. Will follow on future visits.  Esperanza Richters, PA-C

## 2020-10-29 NOTE — Patient Instructions (Signed)
For you wellness exam today I have ordered cbc, cmp, tsh,  hiv and lipid panel.  Tdap today.   Recommend exercise and healthy diet.  We will let you know lab results as they come in.  Follow up date appointment will be determined after lab review.    For cough recently after eating recommend strict healthy diet and will prescribe famotadine.  If cough persists despite diet and famotadine then get chest xray.   Bp better on recheck. Will follow on future visits.  Preventive Care 44-44 Years Old, Male Preventive care refers to lifestyle choices and visits with your health care provider that can promote health and wellness. This includes: A yearly physical exam. This is also called an annual wellness visit. Regular dental and eye exams. Immunizations. Screening for certain conditions. Healthy lifestyle choices, such as: Eating a healthy diet. Getting regular exercise. Not using drugs or products that contain nicotine and tobacco. Limiting alcohol use. What can I expect for my preventive care visit? Physical exam Your health care provider will check your: Height and weight. These may be used to calculate your BMI (body mass index). BMI is a measurement that tells if you are at a healthy weight. Heart rate and blood pressure. Body temperature. Skin for abnormal spots. Counseling Your health care provider may ask you questions about your: Past medical problems. Family's medical history. Alcohol, tobacco, and drug use. Emotional well-being. Home life and relationship well-being. Sexual activity. Diet, exercise, and sleep habits. Work and work Astronomer. Access to firearms. What immunizations do I need?  Vaccines are usually given at various ages, according to a schedule. Your health care provider will recommend vaccines for you based on your age, medicalhistory, and lifestyle or other factors, such as travel or where you work. What tests do I need? Blood tests Lipid and  cholesterol levels. These may be checked every 5 years, or more often if you are over 44 years old. Hepatitis C test. Hepatitis B test. Screening Lung cancer screening. You may have this screening every year starting at age 44 if you have a 30-pack-year history of smoking and currently smoke or have quit within the past 15 years. Prostate cancer screening. Recommendations will vary depending on your family history and other risks. Genital exam to check for testicular cancer or hernias. Colorectal cancer screening. All adults should have this screening starting at age 44 and continuing until age 44. Your health care provider may recommend screening at age 44 if you are at increased risk. You will have tests every 1-10 years, depending on your results and the type of screening test. Diabetes screening. This is done by checking your blood sugar (glucose) after you have not eaten for a while (fasting). You may have this done every 1-3 years. STD (sexually transmitted disease) testing, if you are at risk. Follow these instructions at home: Eating and drinking  Eat a diet that includes fresh fruits and vegetables, whole grains, lean protein, and low-fat dairy products. Take vitamin and mineral supplements as recommended by your health care provider. Do not drink alcohol if your health care provider tells you not to drink. If you drink alcohol: Limit how much you have to 0-2 drinks a day. Be aware of how much alcohol is in your drink. In the [place] one drink equals one 12 oz bottle of beer (355 mL), one 5 oz glass of wine (148 mL), or one 1 oz glass of hard liquor (44 mL).  Lifestyle Take daily care of  your teeth and gums. Brush your teeth every morning and night with fluoride toothpaste. Floss one time each day. Stay active. Exercise for at least 30 minutes 5 or more days each week. Do not use any products that contain nicotine or tobacco, such as cigarettes, e-cigarettes, and chewing tobacco.  If you need help quitting, ask your health care provider. Do not use drugs. If you are sexually active, practice safe sex. Use a condom or other form of protection to prevent STIs (sexually transmitted infections). If told by your health care provider, take low-dose aspirin daily starting at age 44. Find healthy ways to cope with stress, such as: Meditation, yoga, or listening to music. Journaling. Talking to a trusted person. Spending time with friends and family. Safety Always wear your seat belt while driving or riding in a vehicle. Do not drive: If you have been drinking alcohol. Do not ride with someone who has been drinking. When you are tired or distracted. While texting. Wear a helmet and other protective equipment during sports activities. If you have firearms in your house, make sure you follow all gun safety procedures. What's next? Go to your health care provider once a year for an annual wellness visit. Ask your health care provider how often you should have your eyes and teeth checked. Stay up to date on all vaccines. This information is not intended to replace advice given to you by your health care provider. Make sure you discuss any questions you have with your healthcare provider. Document Revised: 12/10/2018 Document Reviewed: 03/07/2018 Elsevier Patient Education  2022 [employer].

## 2020-10-29 NOTE — Addendum Note (Signed)
Addended by: Maximino Sarin on: 10/29/2020 10:20 AM   Modules accepted: Orders

## 2020-11-01 LAB — HIV ANTIBODY (ROUTINE TESTING W REFLEX): HIV 1&2 Ab, 4th Generation: NONREACTIVE

## 2020-12-25 ENCOUNTER — Other Ambulatory Visit: Payer: Self-pay

## 2020-12-25 ENCOUNTER — Encounter (HOSPITAL_BASED_OUTPATIENT_CLINIC_OR_DEPARTMENT_OTHER): Payer: Self-pay | Admitting: Emergency Medicine

## 2020-12-25 ENCOUNTER — Emergency Department (HOSPITAL_BASED_OUTPATIENT_CLINIC_OR_DEPARTMENT_OTHER)
Admission: EM | Admit: 2020-12-25 | Discharge: 2020-12-25 | Disposition: A | Discharge status: Home / Self Care | Payer: 59 | Attending: Emergency Medicine | Admitting: Emergency Medicine

## 2020-12-25 DIAGNOSIS — R000 Tachycardia, unspecified: Secondary | ICD-10-CM | POA: Diagnosis not present

## 2020-12-25 DIAGNOSIS — R509 Fever, unspecified: Secondary | ICD-10-CM | POA: Diagnosis present

## 2020-12-25 DIAGNOSIS — J111 Influenza due to unidentified influenza virus with other respiratory manifestations: Secondary | ICD-10-CM | POA: Diagnosis not present

## 2020-12-25 DIAGNOSIS — Z20822 Contact with and (suspected) exposure to covid-19: Secondary | ICD-10-CM | POA: Insufficient documentation

## 2020-12-25 MED ORDER — ACETAMINOPHEN 500 MG PO TABS
1000.0000 mg | ORAL_TABLET | Freq: Once | ORAL | Status: AC
  Administered 2020-12-25: 1000 mg via ORAL
  Filled 2020-12-25: qty 2

## 2020-12-25 NOTE — ED Notes (Signed)
Pt states fever, body aches and sore throat began yesterday.  States Father is sick as well, just returned from [place] from funeral. Tylenol given and covid test sent

## 2020-12-25 NOTE — ED Provider Notes (Signed)
[facility] EMERGENCY DEPARTMENT Provider Note   CSN: [id number] Arrival date & time: 12/25/20  1459     History Chief Complaint  Patient presents with   Generalized Body Aches   Fever    Dale Harper is a 45 y.o. male.  Patient is a 45 year old male who presents with flulike symptoms.  He states he has had fevers, body aches sore throat and congestion in his nose for 2 days.  He denies any significant coughing.  No shortness of breath.  No vomiting or diarrhea.  His father has similar symptoms.  He has been taking over-the-counter medications with some improvement in symptoms.  He denies any ongoing health problems.      History reviewed. No pertinent past medical history.  Patient Active Problem List   Diagnosis Date Noted   Cough 04/07/2011    History reviewed. No pertinent surgical history.     No family history on file.  Social History   Tobacco Use   Smoking status: Never   Smokeless tobacco: Never  Vaping Use   Vaping Use: Never used  Substance Use Topics   Alcohol use: Yes    Alcohol/week: 1.0 standard drink    Types: 1 Glasses of wine per week    Comment: every 6 monhts.   Drug use: No    Home Medications Prior to Admission medications   Medication Sig Start Date End Date Taking? Authorizing Provider  benzonatate (TESSALON) 200 MG capsule Take 1 capsule (200 mg total) by mouth 3 (three) times daily as needed for cough. 04/09/14   Excell Seltzer, Adrian Blackwater, PA-C  famotidine (PEPCID) 20 MG tablet Take 1 tablet (20 mg total) by mouth daily. 10/29/20   Saguier, Ramon Dredge, PA-C    Allergies    Patient has no known allergies.  Review of Systems   Review of Systems  Constitutional:  Positive for fatigue and fever. Negative for chills and diaphoresis.  HENT:  Positive for congestion, rhinorrhea and sore throat. Negative for sneezing.   Eyes: Negative.   Respiratory:  Positive for cough. Negative for chest tightness and shortness of breath.    Cardiovascular:  Negative for chest pain and leg swelling.  Gastrointestinal:  Negative for abdominal pain, blood in stool, diarrhea, nausea and vomiting.  Genitourinary:  Negative for difficulty urinating, flank pain, frequency and hematuria.  Musculoskeletal:  Positive for myalgias. Negative for arthralgias and back pain.  Skin:  Negative for rash.  Neurological:  Negative for dizziness, speech difficulty, weakness, numbness and headaches.   Physical Exam Updated Vital Signs BP (!) 126/96   Pulse (!) 108   Temp 99.6 F (37.6 C) (Oral)   Resp 18   Ht 5\' 11"  (1.803 m)   Wt 102.1 kg   SpO2 98%   BMI 31.38 kg/m   Physical Exam Constitutional:      General: He is not in acute distress.    Appearance: He is well-developed. He is not ill-appearing.  HENT:     Head: Normocephalic and atraumatic.     Mouth/Throat:     Pharynx: Oropharyngeal exudate present. No posterior oropharyngeal erythema.  Eyes:     Pupils: Pupils are equal, round, and reactive to light.  Neck:     Comments: No meningismus Cardiovascular:     Rate and Rhythm: Normal rate and regular rhythm.     Heart sounds: Normal heart sounds.  Pulmonary:     Effort: Pulmonary effort is normal. No respiratory distress.     Breath sounds:  Normal breath sounds. No wheezing or rales.  Chest:     Chest wall: No tenderness.  Abdominal:     General: Bowel sounds are normal.     Palpations: Abdomen is soft.     Tenderness: There is no abdominal tenderness. There is no guarding or rebound.  Musculoskeletal:        General: Normal range of motion.     Cervical back: Normal range of motion and neck supple.  Lymphadenopathy:     Cervical: No cervical adenopathy.  Skin:    General: Skin is warm and dry.     Findings: No rash.  Neurological:     Mental Status: He is alert and oriented to person, place, and time.    ED Results / Procedures / Treatments   Labs (all labs ordered are listed, but only abnormal results are  displayed) Labs Reviewed  SARS CORONAVIRUS 2 (TAT 6-24 HRS)    EKG None  Radiology No results found.  Procedures Procedures   Medications Ordered in ED Medications  acetaminophen (TYLENOL) tablet 1,000 mg (1,000 mg Oral Given 12/25/20 1618)    ED Course  I have reviewed the triage vital signs and the nursing notes.  Pertinent labs & imaging results that were available during my care of the patient were reviewed by me and considered in my medical decision making (see chart for details).    MDM Rules/Calculators/A&P                           Patient is a 45 year old male who presents with flulike symptoms.  He is otherwise well-appearing.  He has mild tachycardia in the low 100s which is likely resulting from his illness and mildly increased temperature.  He has no suggestions of pneumonia.  No hypoxia or shortness of breath.  No abdominal pain.  No suggestions of meningitis.  He is nontoxic-appearing.  He had a COVID test done which is pending.  I discussed doing a flu test although likely would not change management.  He declines at this point.  He was discharged home in good condition.  Symptomatic care instructions were given.  Return precautions were given. Final Clinical Impression(s) / ED Diagnoses Final diagnoses:  Influenza-like illness    Rx / DC Orders ED Discharge Orders     None        Rolan Bucco, MD 12/25/20 1641

## 2020-12-25 NOTE — ED Triage Notes (Signed)
Pt reports body aches, fever since yesterday

## 2020-12-26 LAB — SARS CORONAVIRUS 2 (TAT 6-24 HRS): SARS Coronavirus 2: NEGATIVE

## 2021-04-01 DIAGNOSIS — R531 Weakness: Secondary | ICD-10-CM | POA: Diagnosis not present

## 2021-04-01 DIAGNOSIS — R918 Other nonspecific abnormal finding of lung field: Secondary | ICD-10-CM | POA: Diagnosis not present

## 2021-04-01 DIAGNOSIS — J181 Lobar pneumonia, unspecified organism: Secondary | ICD-10-CM | POA: Diagnosis not present

## 2021-04-01 DIAGNOSIS — U071 COVID-19: Secondary | ICD-10-CM | POA: Diagnosis not present

## 2021-04-01 DIAGNOSIS — R509 Fever, unspecified: Secondary | ICD-10-CM | POA: Diagnosis not present

## 2021-04-01 DIAGNOSIS — E860 Dehydration: Secondary | ICD-10-CM | POA: Diagnosis not present

## 2021-04-01 DIAGNOSIS — J1282 Pneumonia due to coronavirus disease 2019: Secondary | ICD-10-CM | POA: Diagnosis not present

## 2021-04-01 DIAGNOSIS — R519 Headache, unspecified: Secondary | ICD-10-CM | POA: Diagnosis not present

## 2021-04-15 ENCOUNTER — Ambulatory Visit (HOSPITAL_BASED_OUTPATIENT_CLINIC_OR_DEPARTMENT_OTHER)
Admission: RE | Admit: 2021-04-15 | Discharge: 2021-04-15 | Disposition: A | Discharge status: Home / Self Care | Payer: 59 | Source: Ambulatory Visit | Attending: Medical | Admitting: Medical

## 2021-04-15 ENCOUNTER — Ambulatory Visit: Payer: 59 | Admitting: Medical

## 2021-04-15 ENCOUNTER — Other Ambulatory Visit: Payer: Self-pay

## 2021-04-15 ENCOUNTER — Encounter: Payer: Self-pay | Admitting: Medical

## 2021-04-15 VITALS — BP 113/80 | HR 73 | Temp 98.3°F | Resp 18 | Ht 71.0 in | Wt 211.8 lb

## 2021-04-15 DIAGNOSIS — R5383 Other fatigue: Secondary | ICD-10-CM | POA: Diagnosis not present

## 2021-04-15 DIAGNOSIS — J189 Pneumonia, unspecified organism: Secondary | ICD-10-CM | POA: Diagnosis not present

## 2021-04-15 DIAGNOSIS — U071 COVID-19: Secondary | ICD-10-CM | POA: Diagnosis not present

## 2021-04-15 DIAGNOSIS — R059 Cough, unspecified: Secondary | ICD-10-CM | POA: Insufficient documentation

## 2021-04-15 LAB — CBC WITH DIFFERENTIAL/PLATELET
Absolute Monocytes: 374 cells/uL (ref 200–950)
Basophils Absolute: 10 cells/uL (ref 0–200)
Basophils Relative: 0.2 %
Eosinophils Absolute: 73 cells/uL (ref 15–500)
Eosinophils Relative: 1.4 %
HCT: 46.4 % (ref 38.5–50.0)
Hemoglobin: 15.8 g/dL (ref 13.2–17.1)
Lymphs Abs: 2064 cells/uL (ref 850–3900)
MCH: 30.7 pg (ref 27.0–33.0)
MCHC: 34.1 g/dL (ref 32.0–36.0)
MCV: 90.3 fL (ref 80.0–100.0)
MPV: 9.6 fL (ref 7.5–12.5)
Monocytes Relative: 7.2 %
Neutro Abs: 2678 cells/uL (ref 1500–7800)
Neutrophils Relative %: 51.5 %
Platelets: 291 10*3/uL (ref 140–400)
RBC: 5.14 10*6/uL (ref 4.20–5.80)
RDW: 13.2 % (ref 11.0–15.0)
Total Lymphocyte: 39.7 %
WBC: 5.2 10*3/uL (ref 3.8–10.8)

## 2021-04-15 MED ORDER — DOXYCYCLINE HYCLATE 100 MG PO TABS: 100.0000 mg | ORAL_TABLET | Freq: Two times a day (BID) | ORAL | 0 refills | Status: DC

## 2021-04-15 NOTE — Patient Instructions (Signed)
Covid infection 2 weeks ago. Overall feels better but residual cough, fatigue and occasional sweats.   Cxr showed suspicious left lower lobe pneumonia. Cbc pending.   Rx doxycycline antibiotic. Send result note explaining plan going forward.  Follow up in 2 weeks or sooner if needed.

## 2021-04-15 NOTE — Progress Notes (Signed)
Subjective:    Patient ID: Dale Harper, male    DOB: 03/24/1976, 46 y.o.   MRN: [id number]  HPI  Pt had covid and then got pneumonia about 2 weeks ago.  This happened on 04-01-2020. Pt given zpack for covid pneumonia. Pt had on 2 covid vaccines in the past. He states feels a lot better.  He states at this point he only feels slight fatigue and occasional sweating.   No wheezing. Does occasional feel mild sob.  Rare occasional cough.     Review of Systems  Constitutional:  Positive for diaphoresis and fatigue. Negative for chills and fever.  HENT:  Negative for congestion and dental problem.   Respiratory:  Positive for cough. Negative for chest tightness, shortness of breath and wheezing.   Cardiovascular:  Negative for chest pain and palpitations.  Gastrointestinal:  Negative for diarrhea and nausea.  Genitourinary:  Negative for urgency.  Musculoskeletal:  Negative for back pain, myalgias and neck stiffness.  Skin:  Negative for rash.  Neurological:  Negative for dizziness, syncope, light-headedness and headaches.  Hematological:  Negative for adenopathy. Does not bruise/bleed easily.  Psychiatric/Behavioral:  Negative for behavioral problems and confusion. The patient is not hyperactive.     No past medical history on file.   Social History   Socioeconomic History   Marital status: Widowed    Spouse name: Not on file   Number of children: Not on file   Years of education: Not on file   Highest education level: Not on file  Occupational History   Not on file  Tobacco Use   Smoking status: Never   Smokeless tobacco: Never  Vaping Use   Vaping Use: Never used  Substance and Sexual Activity   Alcohol use: Yes    Alcohol/week: 1.0 standard drink    Types: 1 Glasses of wine per week    Comment: every 6 monhts.   Drug use: No   Sexual activity: Not on file  Other Topics Concern   Not on file  Social History Narrative   Not on file   Social Determinants  of Health   Financial Resource Strain: Not on file  Food Insecurity: Not on file  Transportation Needs: Not on file  Physical Activity: Not on file  Stress: Not on file  Social Connections: Not on file  Intimate Partner Violence: Not on file    No past surgical history on file.  No family history on file.  No Known Allergies  Current Outpatient Medications on File Prior to Visit  Medication Sig Dispense Refill   famotidine (PEPCID) 20 MG tablet Take 1 tablet (20 mg total) by mouth daily. 30 tablet 0   No current facility-administered medications on file prior to visit.    BP 113/80    Pulse 73    Temp 98.3 F (36.8 C)    Resp 18    Ht 5\' 11"  (1.803 m)    Wt 211 lb 12.8 oz (96.1 kg)    SpO2 98%    BMI 29.54 kg/m        Objective:   Physical Exam  General- No acute distress. Pleasant patient. Neck- Full range of motion, no jvd Lungs- Clear, even and unlabored. Heart- regular rate and rhythm. Neurologic- CNII- XII grossly intact.       Assessment & Plan:   Patient Instructions  Covid infection 2 weeks ago. Overall feels better but residual cough, fatigue and occasional sweats.   Cxr showed suspicious left  lower lobe pneumonia. Cbc pending.   Rx doxycycline antibiotic. Send result note explaining plan going forward.  Follow up in 2 weeks or sooner if needed.

## 2021-04-20 ENCOUNTER — Telehealth: Payer: Self-pay | Admitting: Medical

## 2021-04-20 NOTE — Telephone Encounter (Signed)
Forms faxed into front office for saguier to fill out Placed in bin up front

## 2021-04-25 ENCOUNTER — Telehealth: Payer: Self-pay | Admitting: Medical

## 2021-04-25 NOTE — Telephone Encounter (Signed)
Pt had pneumonia post covid and had missed work. Would ask he schedule office visit this week to discuss fmla form, listen to lungs and determine when to repeat chest xray.

## 2021-04-25 NOTE — Telephone Encounter (Signed)
Chart opened to rx med, order lab, review chart, respond to my chart message or send message to staff member  

## 2021-04-25 NOTE — Telephone Encounter (Signed)
Pt has follow up on 04/29/21

## 2021-04-29 ENCOUNTER — Other Ambulatory Visit: Payer: Self-pay

## 2021-04-29 ENCOUNTER — Ambulatory Visit (HOSPITAL_BASED_OUTPATIENT_CLINIC_OR_DEPARTMENT_OTHER)
Admission: RE | Admit: 2021-04-29 | Discharge: 2021-04-29 | Disposition: A | Discharge status: Home / Self Care | Payer: 59 | Source: Ambulatory Visit | Attending: Medical | Admitting: Medical

## 2021-04-29 ENCOUNTER — Ambulatory Visit: Payer: 59 | Admitting: Medical

## 2021-04-29 VITALS — BP 128/82 | HR 100 | Temp 98.5°F | Resp 18 | Ht 71.0 in | Wt 212.0 lb

## 2021-04-29 DIAGNOSIS — J189 Pneumonia, unspecified organism: Secondary | ICD-10-CM | POA: Insufficient documentation

## 2021-04-29 DIAGNOSIS — U071 COVID-19: Secondary | ICD-10-CM | POA: Diagnosis not present

## 2021-04-29 DIAGNOSIS — J301 Allergic rhinitis due to pollen: Secondary | ICD-10-CM | POA: Diagnosis not present

## 2021-04-29 MED ORDER — FLUTICASONE PROPIONATE 50 MCG/ACT NA SUSP: 2.0000 | Freq: Every day | NASAL | 1 refills | Status: DC

## 2021-04-29 MED ORDER — LEVOCETIRIZINE DIHYDROCHLORIDE 5 MG PO TABS: 5.0000 mg | ORAL_TABLET | Freq: Every evening | ORAL | 0 refills | Status: DC

## 2021-04-29 NOTE — Progress Notes (Signed)
Subjective:    Patient ID: Dale Harper, male    DOB: November 10, 1975, 46 y.o.   MRN: [id number]  HPI  Pt in for follow up from his pneumonia. Last cxr showed.   IMPRESSION: Left lower lobe consolidation suspicious for a pneumonia. Imaging follow-up to resolution suggested.  Pt finished 10 days of doxycycline on Monday.   I had seen pt for follow post covid infection.  He got sick on 03-31-2021. ED cave zpack for pneumonia.   He states after doxy he felt normal last Thursday but then yesterday developed mild /faint st, nasal congestion and cough. Hx of seasonal allergies in the past.  Fmla form sent to me. I had asked pt to follow up to discuss his return to work. He expresses some hestitation due to symptoms that started yesterday.    Review of Systems  Constitutional:  Negative for chills, fatigue and fever.  HENT:  Positive for congestion and sore throat. Negative for ear pain.   Respiratory:  Positive for cough. Negative for chest tightness, shortness of breath and wheezing.        Minimal cough.  Cardiovascular:  Negative for chest pain and palpitations.  Gastrointestinal:  Negative for abdominal pain.  Genitourinary:  Negative for dysuria, frequency and penile pain.  Musculoskeletal:  Negative for back pain, myalgias and neck pain.  Skin:  Negative for rash.  Neurological:  Negative for dizziness and light-headedness.  Hematological:  Negative for adenopathy. Does not bruise/bleed easily.  Psychiatric/Behavioral:  Negative for behavioral problems and hallucinations.     No past medical history on file.   Social History   Socioeconomic History   Marital status: Widowed    Spouse name: Not on file   Number of children: Not on file   Years of education: Not on file   Highest education level: Not on file  Occupational History   Not on file  Tobacco Use   Smoking status: Never   Smokeless tobacco: Never  Vaping Use   Vaping Use: Never used  Substance and  Sexual Activity   Alcohol use: Yes    Alcohol/week: 1.0 standard drink    Types: 1 Glasses of wine per week    Comment: every 6 monhts.   Drug use: No   Sexual activity: Not on file  Other Topics Concern   Not on file  Social History Narrative   Not on file   Social Determinants of Health   Financial Resource Strain: Not on file  Food Insecurity: Not on file  Transportation Needs: Not on file  Physical Activity: Not on file  Stress: Not on file  Social Connections: Not on file  Intimate Partner Violence: Not on file    No past surgical history on file.  No family history on file.  No Known Allergies  Current Outpatient Medications on File Prior to Visit  Medication Sig Dispense Refill   doxycycline (VIBRA-TABS) 100 MG tablet Take 1 tablet (100 mg total) by mouth 2 (two) times daily. 20 tablet 0   famotidine (PEPCID) 20 MG tablet Take 1 tablet (20 mg total) by mouth daily. 30 tablet 0   No current facility-administered medications on file prior to visit.    BP 128/82    Pulse 100    Temp 98.5 F (36.9 C)    Resp 18    Ht 5\' 11"  (1.803 m)    Wt 212 lb (96.2 kg)    SpO2 98%    BMI 29.57 kg/m  Objective:   Physical Exam  General Mental Status- Alert. General Appearance- Not in acute distress.   Skin General: Color- Normal Color. Moisture- Normal Moisture.  Neck Carotid Arteries- Normal color. Moisture- Normal Moisture. No carotid bruits. No JVD.  Chest and Lung Exam Auscultation: Breath Sounds:-Normal.  Cardiovascular Auscultation:Rythm- Regular. Murmurs & Other Heart Sounds:Auscultation of the heart reveals- No Murmurs.  Abdomen Inspection:-Inspeection Normal. Palpation/Percussion:Note:No mass. Palpation and Percussion of the abdomen reveal- Non Tender, Non Distended + BS, no rebound or guarding.    Neurologic Cranial Nerve exam:- CN III-XII intact(No nystagmus), symmetric smile. Strength:- 5/5 equal and symmetric strength both upper and lower  extremities.       Assessment & Plan:   Patient Instructions  COVID infection early January with subsequent pneumonia.  Finished Z-Pak and then doxycycline.  Clinically much improved up until yesterday when he developed minimal sore throat, nasal congestion and faint cough.   We will get chest x-ray to assess if pneumonia resolved or improved.  Presently leaning towards not prescribing antibiotics unless the pneumonia looks worse.   Most recent symptoms started yesterday might represent allergic rhinitis and not associated with COVID symptoms already close to 1 month out from diagnosis.  Prescribing Flonase for nasal congestion and Xyzal antihistamine.  Think Xyzal will help with your mild cough.  Filled out majority of FMLA form today and will probably have you return to work on Wednesday provided you are significantly improved by Tuesday morning.  Please call us on Tuesday morning with update.  We will finish remainder of form at that time.    Esperanza Richters, PA-C

## 2021-04-29 NOTE — Patient Instructions (Addendum)
COVID infection early January with subsequent pneumonia.  Finished Z-Pak and then doxycycline.  Clinically much improved up until yesterday when he developed minimal sore throat, nasal congestion and faint cough.   We will get chest x-ray to assess if pneumonia resolved or improved.  Presently leaning towards not prescribing antibiotics unless the pneumonia looks worse.   Most recent symptoms started yesterday might represent allergic rhinitis and not associated with COVID symptoms already close to 1 month out from diagnosis.  Prescribing Flonase for nasal congestion and Xyzal antihistamine.  Think Xyzal will help with your mild cough.  Filled out majority of FMLA form today and will probably have you return to work on Wednesday provided you are significantly improved by Tuesday morning.  Please call us on Tuesday morning with update.  We will finish remainder of form at that time.

## 2021-05-02 ENCOUNTER — Encounter: Payer: Self-pay | Admitting: Medical

## 2021-05-02 ENCOUNTER — Telehealth: Payer: Self-pay | Admitting: Medical

## 2021-05-02 NOTE — Telephone Encounter (Signed)
Letter typed and FMLA faxed

## 2021-05-02 NOTE — Telephone Encounter (Signed)
Saguier wanted patient to inform him if he was okay to return to work on 2/8 patient stated yes

## 2021-12-01 ENCOUNTER — Encounter: Payer: Self-pay | Admitting: Medical

## 2021-12-01 ENCOUNTER — Ambulatory Visit (INDEPENDENT_AMBULATORY_CARE_PROVIDER_SITE_OTHER): Payer: 59 | Admitting: Medical

## 2021-12-01 ENCOUNTER — Ambulatory Visit (HOSPITAL_BASED_OUTPATIENT_CLINIC_OR_DEPARTMENT_OTHER)
Admission: RE | Admit: 2021-12-01 | Discharge: 2021-12-01 | Disposition: A | Discharge status: Home / Self Care | Payer: 59 | Source: Ambulatory Visit | Attending: Medical | Admitting: Medical

## 2021-12-01 VITALS — BP 128/85 | HR 82 | Resp 18 | Ht 71.0 in | Wt 225.0 lb

## 2021-12-01 DIAGNOSIS — R0600 Dyspnea, unspecified: Secondary | ICD-10-CM | POA: Diagnosis not present

## 2021-12-01 DIAGNOSIS — Z0000 Encounter for general adult medical examination without abnormal findings: Secondary | ICD-10-CM

## 2021-12-01 DIAGNOSIS — Z8616 Personal history of COVID-19: Secondary | ICD-10-CM | POA: Insufficient documentation

## 2021-12-01 DIAGNOSIS — R0609 Other forms of dyspnea: Secondary | ICD-10-CM | POA: Diagnosis not present

## 2021-12-01 DIAGNOSIS — Z8701 Personal history of pneumonia (recurrent): Secondary | ICD-10-CM | POA: Diagnosis not present

## 2021-12-01 DIAGNOSIS — Z0001 Encounter for general adult medical examination with abnormal findings: Secondary | ICD-10-CM | POA: Diagnosis not present

## 2021-12-01 MED ORDER — ALBUTEROL SULFATE HFA 108 (90 BASE) MCG/ACT IN AERS: 2.0000 | INHALATION_SPRAY | Freq: Four times a day (QID) | RESPIRATORY_TRACT | 0 refills | Status: DC | PRN

## 2021-12-01 NOTE — Patient Instructions (Addendum)
For you wellness exam today I have ordered cbc, cmp and  lipid panel.  Will get flu vaccine thru work at [employer].  Recommend exercise and healthy diet.  We will let you know lab results as they come in.  Occasional mild shortness of breath walking. We discussed hx of covid pneumonia and on xray review elevated hemidiaphram. Will repeat cxr today. If dyspnea or wheezing persists during events can try albuterol inhaler.   Follow up date appointment will be determined after lab review.    Please get labs done fasting. Get scheduled for labs by early next week.

## 2021-12-01 NOTE — Progress Notes (Signed)
Subjective:    Patient ID: Dale Harper, male    DOB: December 18, 1975, 46 y.o.   MRN: [id number]  HPI  Pt here for wellness exam. He just ate omelet with bacon.   Pt works for [employer]. No regular exercise. States moderate healthy diet(some improvement). Non smoker. No alcohol use for a year.  Rare soda. Pt wife had commited suicide after they got divorced last year.  Pt gets flu vaccines thru his work.   Pt just got off work. He works 3rd shift.  Occasional mild shortness of breath walking. We discussed hx of covid pneumonia and on xray review elevated hemidiaphram.  On dicussion occasional wheeze when get sob. But last minutes.  Also notes asthma as a child.  Review of Systems  Constitutional:  Negative for chills, fatigue and fever.  HENT:  Negative for congestion, ear discharge, postnasal drip and sinus pressure.   Respiratory:  Negative for cough, chest tightness, shortness of breath and wheezing.   Cardiovascular:  Negative for chest pain and palpitations.  Gastrointestinal:  Negative for abdominal pain, constipation and nausea.  Musculoskeletal:  Negative for back pain, joint swelling, myalgias and neck stiffness.  Skin:  Negative for rash.  Neurological:  Negative for dizziness, speech difficulty, numbness and headaches.  Hematological:  Negative for adenopathy. Does not bruise/bleed easily.  Psychiatric/Behavioral:  Negative for behavioral problems, dysphoric mood, sleep disturbance and suicidal ideas. The patient is not nervous/anxious.        No report of.    No past medical history on file.   Social History   Socioeconomic History   Marital status: Widowed    Spouse name: Not on file   Number of children: Not on file   Years of education: Not on file   Highest education level: Not on file  Occupational History   Not on file  Tobacco Use   Smoking status: Never   Smokeless tobacco: Never  Vaping Use   Vaping Use: Never used   Substance and Sexual Activity   Alcohol use: Yes    Alcohol/week: 1.0 standard drink of alcohol    Types: 1 Glasses of wine per week    Comment: every 6 monhts.   Drug use: No   Sexual activity: Not on file  Other Topics Concern   Not on file  Social History Narrative   Not on file   Social Determinants of Health   Financial Resource Strain: Not on file  Food Insecurity: Not on file  Transportation Needs: Not on file  Physical Activity: Not on file  Stress: Not on file  Social Connections: Not on file  Intimate Partner Violence: Not on file    No past surgical history on file.  No family history on file.  No Known Allergies  Current Outpatient Medications on File Prior to Visit  Medication Sig Dispense Refill   famotidine (PEPCID) 20 MG tablet Take 1 tablet (20 mg total) by mouth daily. 30 tablet 0   levocetirizine (XYZAL) 5 MG tablet Take 1 tablet (5 mg total) by mouth every evening. 30 tablet 0   No current facility-administered medications on file prior to visit.    BP (!) 140/100   Pulse 82   Resp 18   Ht 5\' 11"  (1.803 m)   Wt 225 lb (102.1 kg)   SpO2 95%   BMI 31.38 kg/m   128/85       Objective:   Physical Exam  General Mental Status-  Alert. General Appearance- Not in acute distress.   Skin General: Color- Normal Color. Moisture- Normal Moisture.  Neck Carotid Arteries- Normal color. Moisture- Normal Moisture. No carotid bruits. No JVD.  Chest and Lung Exam Auscultation: Breath Sounds:-Normal.  Cardiovascular Auscultation:Rythm- Regular. Murmurs & Other Heart Sounds:Auscultation of the heart reveals- No Murmurs.  Abdomen Inspection:-Inspeection Normal. Palpation/Percussion:Note:No mass. Palpation and Percussion of the abdomen reveal- Non Tender, Non Distended + BS, no rebound or guarding.    Neurologic Cranial Nerve exam:- CN III-XII intact(No nystagmus), symmetric smile. Strength:- 5/5 equal and symmetric strength both upper and  lower extremities.       Assessment & Plan:   Patient Instructions  For you wellness exam today I have ordered cbc, cmp and  lipid panel.  Will get flu vaccine thru work at [employer].  Recommend exercise and healthy diet.  We will let you know lab results as they come in.  Occasional mild shortness of breath walking. We discussed hx of covid pneumonia and on xray review elevated hemidiaphram. Will repeat cxr today.   Follow up date appointment will be determined after lab review.          Esperanza Richters, [place]    [id number] charge as well. Addressed mild occasional dyspnea on exertion. Made albuterol availble to use if needed. Got cxr.

## 2021-12-02 ENCOUNTER — Other Ambulatory Visit (INDEPENDENT_AMBULATORY_CARE_PROVIDER_SITE_OTHER): Payer: 59

## 2021-12-02 DIAGNOSIS — Z0000 Encounter for general adult medical examination without abnormal findings: Secondary | ICD-10-CM | POA: Diagnosis not present

## 2021-12-02 LAB — LIPID PANEL
Cholesterol: 140 mg/dL (ref 0–200)
HDL: 35.2 mg/dL — ABNORMAL LOW (ref >39.00)
LDL Cholesterol: 80 mg/dL (ref 0–99)
NonHDL: 104.88
Total CHOL/HDL Ratio: 4
Triglycerides: 125 mg/dL (ref 0.0–149.0)
VLDL: 25 mg/dL (ref 0.0–40.0)

## 2021-12-02 LAB — COMPREHENSIVE METABOLIC PANEL
ALT: 31 U/L (ref 0–53)
AST: 26 U/L (ref 0–37)
Albumin: 4.2 g/dL (ref 3.5–5.2)
Alkaline Phosphatase: 55 U/L (ref 39–117)
BUN: 12 mg/dL (ref 6–23)
CO2: 29 mEq/L (ref 19–32)
Calcium: 9.5 mg/dL (ref 8.4–10.5)
Chloride: 100 mEq/L (ref 96–112)
Creatinine, Ser: 1.17 mg/dL (ref 0.40–1.50)
GFR: 75.68 mL/min (ref >60.00)
Glucose, Bld: 82 mg/dL (ref 70–99)
Potassium: 3.9 mEq/L (ref 3.5–5.1)
Sodium: 137 mEq/L (ref 135–145)
Total Bilirubin: 1.9 mg/dL — ABNORMAL HIGH (ref 0.2–1.2)
Total Protein: 6.9 g/dL (ref 6.0–8.3)

## 2021-12-02 LAB — CBC WITH DIFFERENTIAL/PLATELET
Basophils Absolute: 0 10*3/uL (ref 0.0–0.1)
Basophils Relative: 0.3 % (ref 0.0–3.0)
Eosinophils Absolute: 0.1 10*3/uL (ref 0.0–0.7)
Eosinophils Relative: 2.4 % (ref 0.0–5.0)
HCT: 46.1 % (ref 39.0–52.0)
Hemoglobin: 15.4 g/dL (ref 13.0–17.0)
Lymphocytes Relative: 44.1 % (ref 12.0–46.0)
Lymphs Abs: 2.5 10*3/uL (ref 0.7–4.0)
MCHC: 33.3 g/dL (ref 30.0–36.0)
MCV: 91 fl (ref 78.0–100.0)
Monocytes Absolute: 0.5 10*3/uL (ref 0.1–1.0)
Monocytes Relative: 8.3 % (ref 3.0–12.0)
Neutro Abs: 2.5 10*3/uL (ref 1.4–7.7)
Neutrophils Relative %: 44.9 % (ref 43.0–77.0)
Platelets: 240 10*3/uL (ref 150.0–400.0)
RBC: 5.07 Mil/uL (ref 4.22–5.81)
RDW: 14.2 % (ref 11.5–15.5)
WBC: 5.7 10*3/uL (ref 4.0–10.5)

## 2022-08-23 IMAGING — DX DG CHEST 2V
2 series · 2 of 2 positions shown · non-contrast
Comparison: Radiograph 04/15/2021

CLINICAL DATA: chest xray to evauate if pneumonia resolved.
finished doxy on [REDACTED].

EXAM:
CHEST - 2 VIEW

[chest pa]
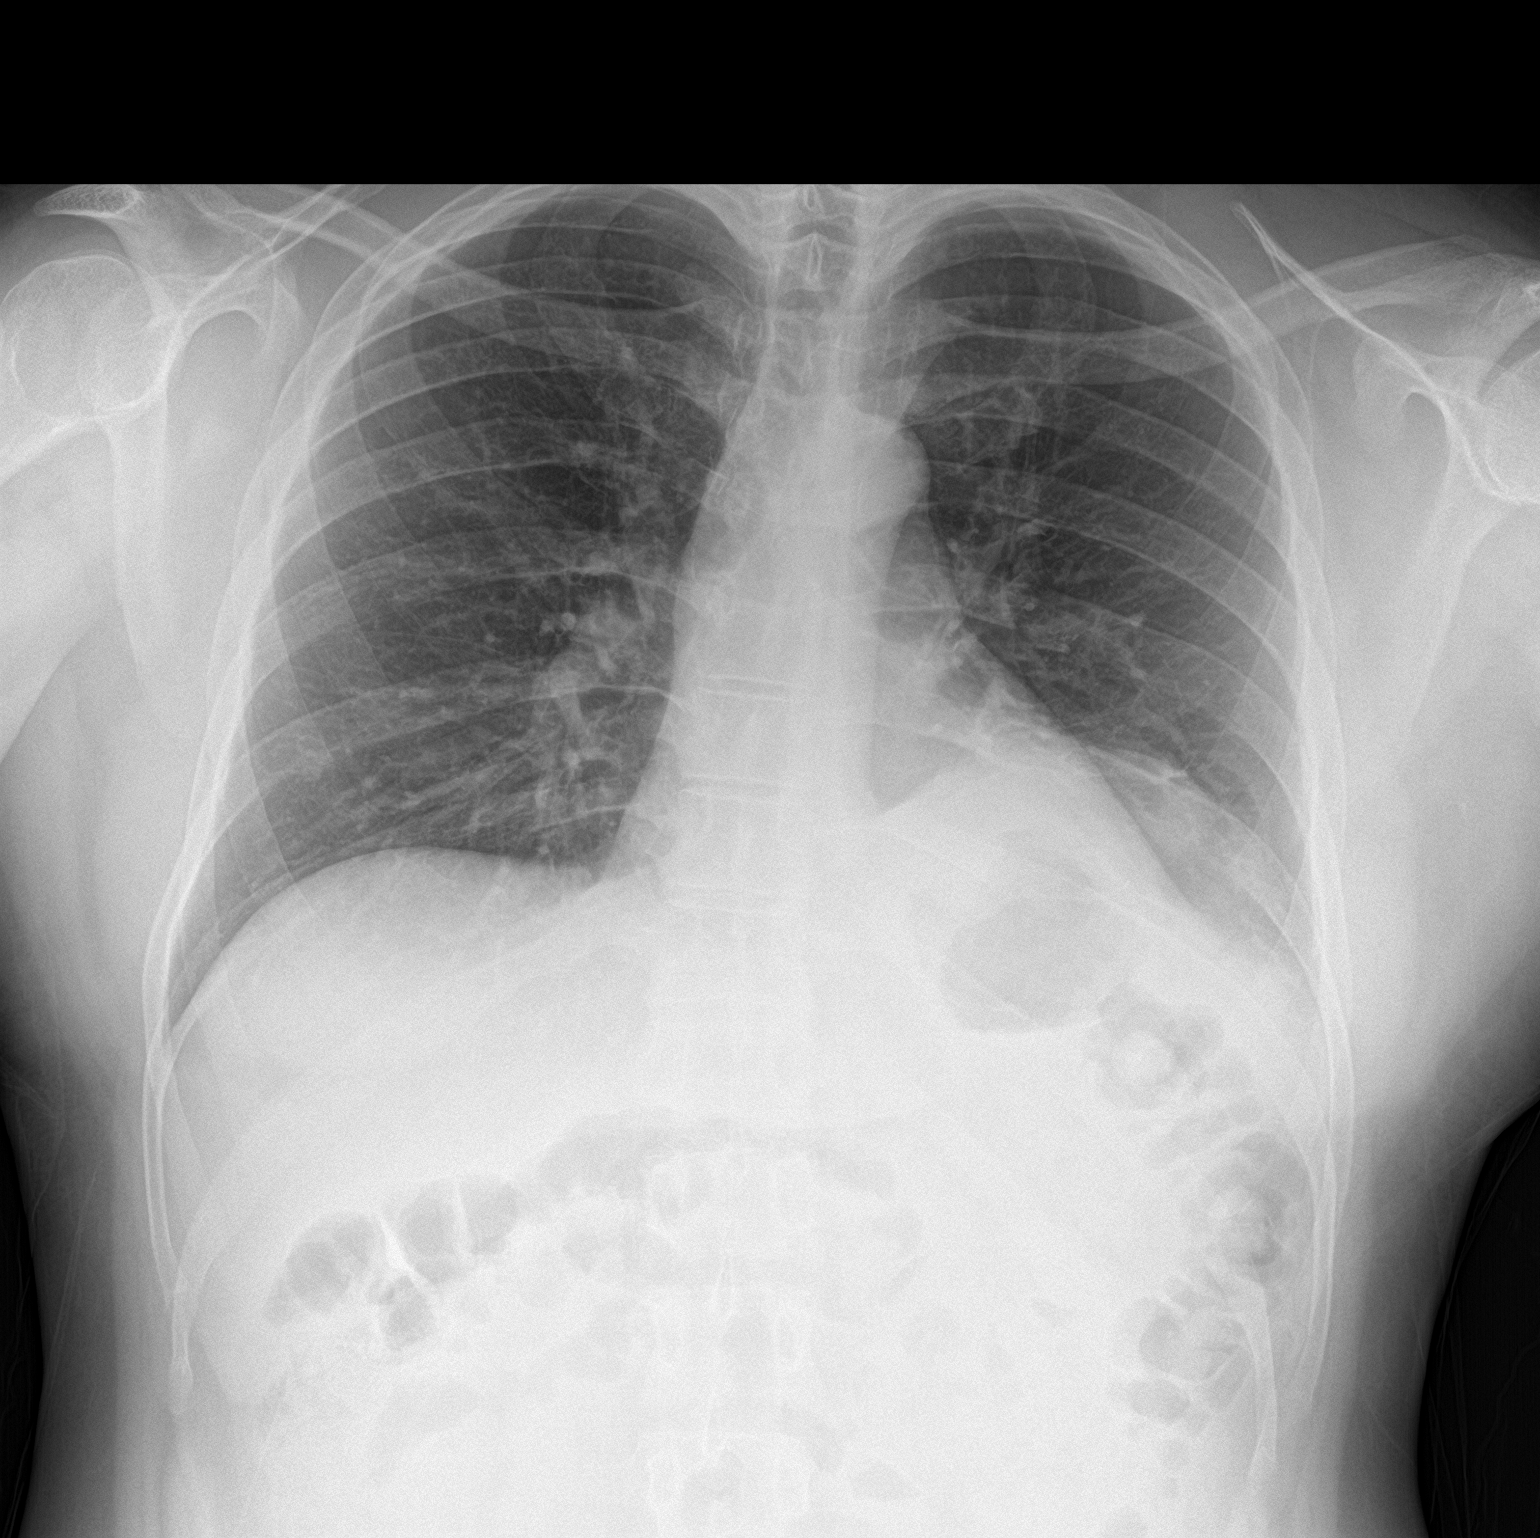

[chest lat]
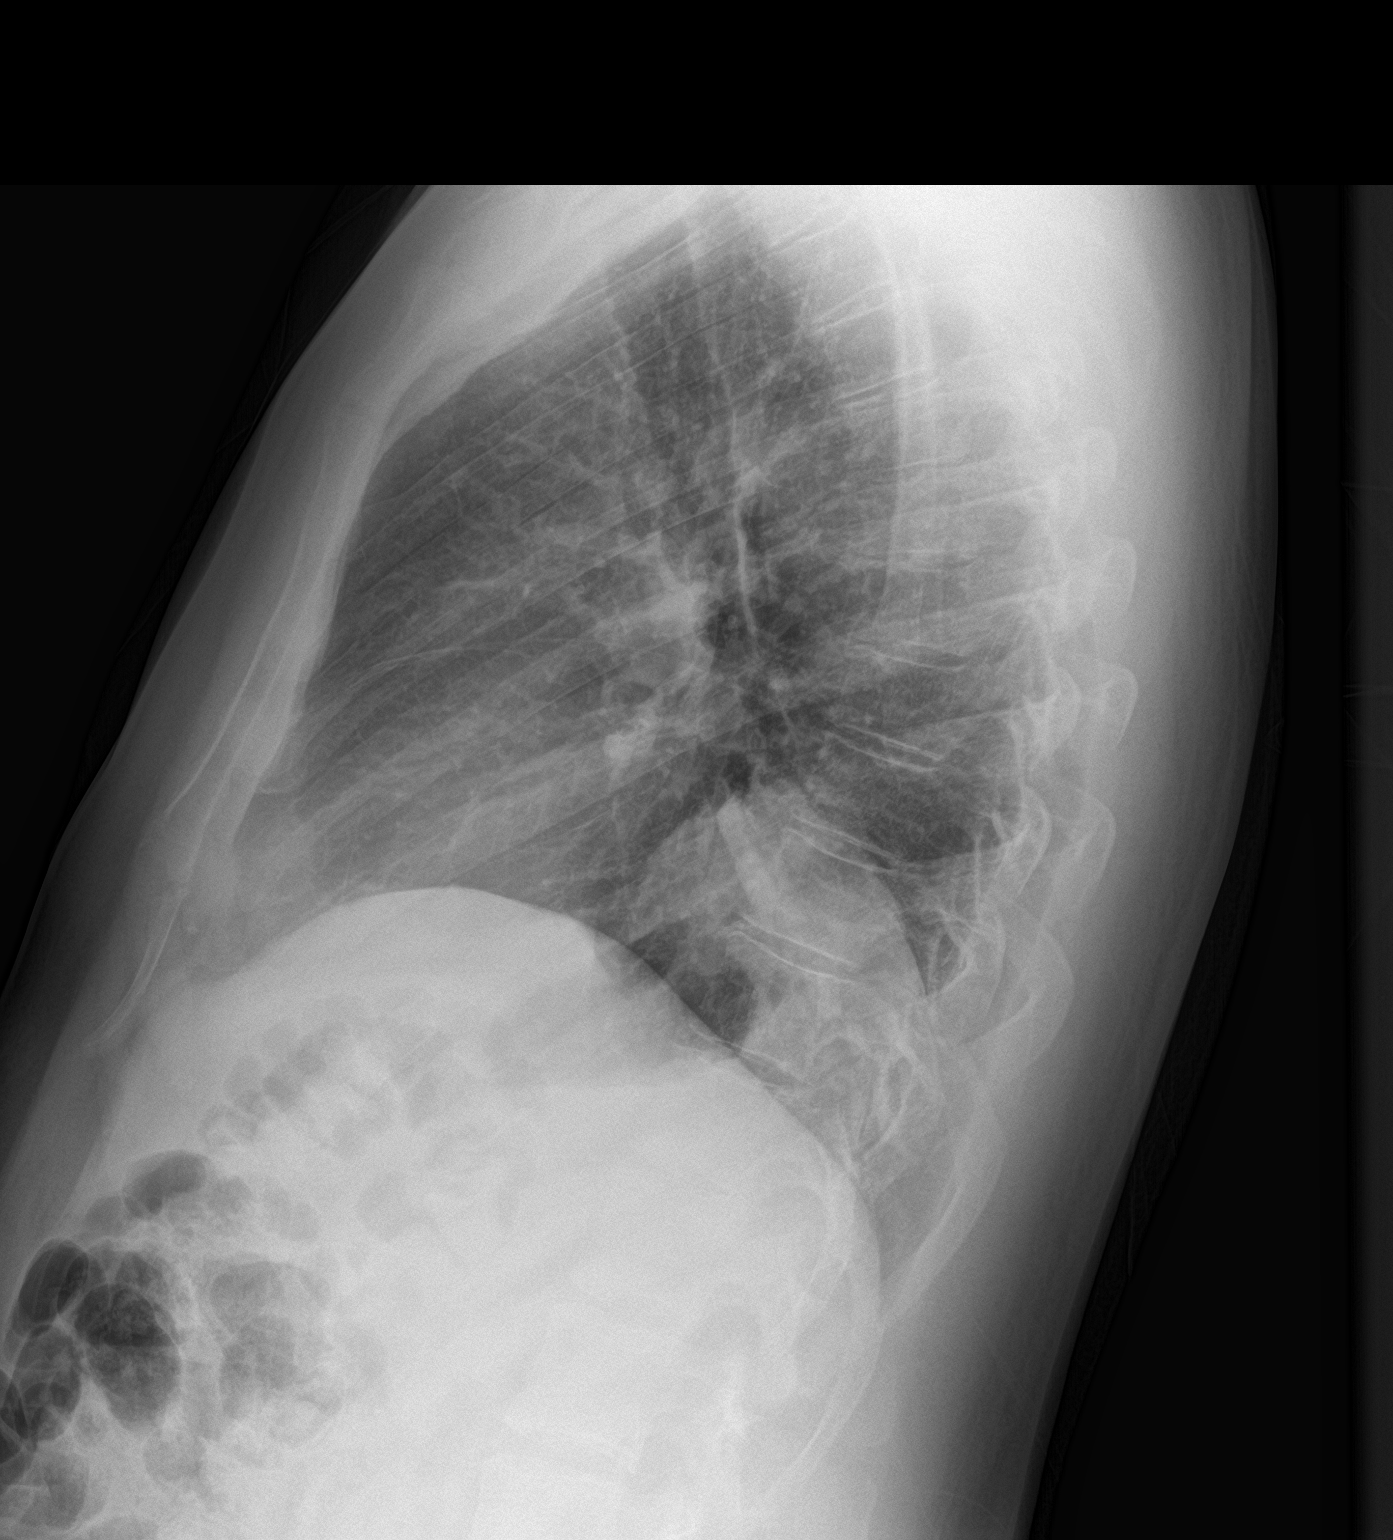

[2 of 2 positions shown; findings below may reference images not displayed]

FINDINGS: Unchanged cardiomediastinal silhouette. In retrospect, the left
lower lobe consolidation noted on the prior exam likely represented
overlapping tissue on the lateral view due to an elevated left
hemidiaphragm, though there is slightly decreased density on today's
exam in comparison to prior. There is no new airspace disease. No
large pleural effusion. No pneumothorax. No acute osseous
abnormality.
IMPRESSION: Elevated left hemidiaphragm.  No focal airspace consolidation.

## 2022-10-25 ENCOUNTER — Encounter (INDEPENDENT_AMBULATORY_CARE_PROVIDER_SITE_OTHER): Payer: Self-pay

## 2022-11-13 ENCOUNTER — Encounter: Payer: 59 | Admitting: Medical

## 2022-11-17 ENCOUNTER — Ambulatory Visit (INDEPENDENT_AMBULATORY_CARE_PROVIDER_SITE_OTHER): Payer: Commercial Managed Care - PPO | Admitting: Medical

## 2022-11-17 VITALS — BP 118/60 | HR 74 | Temp 97.6°F | Resp 18 | Ht 71.0 in | Wt 234.0 lb

## 2022-11-17 DIAGNOSIS — Z125 Encounter for screening for malignant neoplasm of prostate: Secondary | ICD-10-CM | POA: Diagnosis not present

## 2022-11-17 DIAGNOSIS — Z1211 Encounter for screening for malignant neoplasm of colon: Secondary | ICD-10-CM

## 2022-11-17 DIAGNOSIS — Z0000 Encounter for general adult medical examination without abnormal findings: Secondary | ICD-10-CM | POA: Diagnosis not present

## 2022-11-17 LAB — LIPID PANEL
Cholesterol: 140 mg/dL (ref 0–200)
HDL: 35.9 mg/dL — ABNORMAL LOW (ref >39.00)
LDL Cholesterol: 82 mg/dL (ref 0–99)
NonHDL: 104.44
Total CHOL/HDL Ratio: 4
Triglycerides: 112 mg/dL (ref 0.0–149.0)
VLDL: 22.4 mg/dL (ref 0.0–40.0)

## 2022-11-17 LAB — CBC WITH DIFFERENTIAL/PLATELET
Basophils Absolute: 0.1 10*3/uL (ref 0.0–0.1)
Basophils Relative: 0.9 % (ref 0.0–3.0)
Eosinophils Absolute: 0.1 10*3/uL (ref 0.0–0.7)
Eosinophils Relative: 2.2 % (ref 0.0–5.0)
HCT: 45.4 % (ref 39.0–52.0)
Hemoglobin: 15 g/dL (ref 13.0–17.0)
Lymphocytes Relative: 39 % (ref 12.0–46.0)
Lymphs Abs: 2.5 10*3/uL (ref 0.7–4.0)
MCHC: 32.9 g/dL (ref 30.0–36.0)
MCV: 90.2 fl (ref 78.0–100.0)
Monocytes Absolute: 0.5 10*3/uL (ref 0.1–1.0)
Monocytes Relative: 8.2 % (ref 3.0–12.0)
Neutro Abs: 3.2 10*3/uL (ref 1.4–7.7)
Neutrophils Relative %: 49.7 % (ref 43.0–77.0)
Platelets: 327 10*3/uL (ref 150.0–400.0)
RBC: 5.04 Mil/uL (ref 4.22–5.81)
RDW: 14.4 % (ref 11.5–15.5)
WBC: 6.5 10*3/uL (ref 4.0–10.5)

## 2022-11-17 LAB — COMPREHENSIVE METABOLIC PANEL
ALT: 27 U/L (ref 0–53)
AST: 20 U/L (ref 0–37)
Albumin: 4.1 g/dL (ref 3.5–5.2)
Alkaline Phosphatase: 54 U/L (ref 39–117)
BUN: 14 mg/dL (ref 6–23)
CO2: 31 mEq/L (ref 19–32)
Calcium: 9.5 mg/dL (ref 8.4–10.5)
Chloride: 101 mEq/L (ref 96–112)
Creatinine, Ser: 1.12 mg/dL (ref 0.40–1.50)
GFR: 79.22 mL/min (ref >60.00)
Glucose, Bld: 108 mg/dL — ABNORMAL HIGH (ref 70–99)
Potassium: 4 mEq/L (ref 3.5–5.1)
Sodium: 141 mEq/L (ref 135–145)
Total Bilirubin: 1 mg/dL (ref 0.2–1.2)
Total Protein: 6.8 g/dL (ref 6.0–8.3)

## 2022-11-17 LAB — PSA: PSA: 0.43 ng/mL (ref 0.10–4.00)

## 2022-11-17 NOTE — Patient Instructions (Addendum)
For you wellness exam today I have ordered cbc, psa, cmp and lipid panel.  Vaccines flu thru [facility] when offered. Consider covid vaccine as we discussed.  Placed referral for colonoscopy. Recommend that you call to get scheduled.   Recommend exercise and healthy diet.  We will let you know lab results as they come in.  Follow up date appointment will be determined after lab review.    Preventive Care 18-46 Years Old, Male Preventive care refers to lifestyle choices and visits with your health care provider that can promote health and wellness. Preventive care visits are also called wellness exams. What can I expect for my preventive care visit? Counseling During your preventive care visit, your health care provider may ask about your: Medical history, including: Past medical problems. Family medical history. Current health, including: Emotional well-being. Home life and relationship well-being. Sexual activity. Lifestyle, including: Alcohol, nicotine or tobacco, and drug use. Access to firearms. Diet, exercise, and sleep habits. Safety issues such as seatbelt and bike helmet use. Sunscreen use. Work and work Astron[employer]. Physical exam Your health care provider will check your: Height and weight. These may be used to calculate your BMI (body mass index). BMI is a measurement that tells if you are at a healthy weight. Waist circumference. This measures the distance around your waistline. This measurement also tells if you are at a healthy weight and may help predict your risk of certain diseases, such as type 2 diabetes and high blood pressure. Heart rate and blood pressure. Body temperature. Skin for abnormal spots. What immunizations do I need?  Vaccines are usually given at various ages, according to a schedule. Your health care provider will recommend vaccines for you based on your age, medical history, and lifestyle or other factors, such as travel or where you work. What  tests do I need? Screening Your health care provider may recommend screening tests for certain conditions. This may include: Lipid and cholesterol levels. Diabetes screening. This is done by checking your blood sugar (glucose) after you have not eaten for a while (fasting). Hepatitis B test. Hepatitis C test. HIV (human immunodeficiency virus) test. STI (sexually transmitted infection) testing, if you are at risk. Lung cancer screening. Prostate cancer screening. Colorectal cancer screening. Talk with your health care provider about your test results, treatment options, and if necessary, the need for more tests. Follow these instructions at home: Eating and drinking  Eat a diet that includes fresh fruits and vegetables, whole grains, lean protein, and low-fat dairy products. Take vitamin and mineral supplements as recommended by your health care provider. Do not drink alcohol if your health care provider tells you not to drink. If you drink alcohol: Limit how much you have to 0-2 drinks a day. Know how much alcohol is in your drink. In the [place] one drink equals one 12 oz bottle of beer (355 mL), one 5 oz glass of wine (148 mL), or one 1 oz glass of hard liquor (44 mL). Lifestyle Brush your teeth every morning and night with fluoride toothpaste. Floss one time each day. Exercise for at least 30 minutes 5 or more days each week. Do not use any products that contain nicotine or tobacco. These products include cigarettes, chewing tobacco, and vaping devices, such as e-cigarettes. If you need help quitting, ask your health care provider. Do not use drugs. If you are sexually active, practice safe sex. Use a condom or other form of protection to prevent STIs. Take aspirin only as told by  your health care provider. Make sure that you understand how much to take and what form to take. Work with your health care provider to find out whether it is safe and beneficial for you to take aspirin  daily. Find healthy ways to manage stress, such as: Meditation, yoga, or listening to music. Journaling. Talking to a trusted person. Spending time with friends and family. Minimize exposure to UV radiation to reduce your risk of skin cancer. Safety Always wear your seat belt while driving or riding in a vehicle. Do not drive: If you have been drinking alcohol. Do not ride with someone who has been drinking. When you are tired or distracted. While texting. If you have been using any mind-altering substances or drugs. Wear a helmet and other protective equipment during sports activities. If you have firearms in your house, make sure you follow all gun safety procedures. What's next? Go to your health care provider once a year for an annual wellness visit. Ask your health care provider how often you should have your eyes and teeth checked. Stay up to date on all vaccines. This information is not intended to replace advice given to you by your health care provider. Make sure you discuss any questions you have with your health care provider. Document Revised: 09/08/2020 Document Reviewed: 09/08/2020 Elsevier Patient Education  2024 [employer].

## 2022-11-17 NOTE — Progress Notes (Signed)
Subjective:    Patient ID: Dale Harper, male    DOB: 1975/05/06, 47 y.o.   MRN: [id number]  HPI Pt here for wellness exam. Pt is fasting today.   Pt works for [employer]. No regular exercise. States moderate healthy diet(some improvement). Non smoker. No alcohol use for a year.  Rare soda.   Pt gets flu vaccines thru his work.    Pt just got off work. He works 3rd shift.     Review of Systems  Constitutional:  Negative for chills, fatigue and fever.  HENT:  Negative for congestion, ear discharge, postnasal drip and sinus pressure.   Respiratory:  Negative for cough, chest tightness, shortness of breath and wheezing.   Cardiovascular:  Negative for chest pain and palpitations.  Gastrointestinal:  Negative for abdominal pain, constipation and nausea.  Musculoskeletal:  Negative for back pain, joint swelling, myalgias and neck stiffness.  Skin:  Negative for rash.  Neurological:  Negative for dizziness, speech difficulty, numbness and headaches.  Hematological:  Negative for adenopathy. Does not bruise/bleed easily.  Psychiatric/Behavioral:  Negative for behavioral problems, dysphoric mood, sleep disturbance and suicidal ideas. The patient is not nervous/anxious.        No report of.    No past medical history on file.   Social History   Socioeconomic History   Marital status: Widowed    Spouse name: Not on file   Number of children: Not on file   Years of education: Not on file   Highest education level: Not on file  Occupational History   Not on file  Tobacco Use   Smoking status: Never   Smokeless tobacco: Never  Vaping Use   Vaping status: Never Used  Substance and Sexual Activity   Alcohol use: Yes    Alcohol/week: 1.0 standard drink of alcohol    Types: 1 Glasses of wine per week    Comment: every 6 monhts.   Drug use: No   Sexual activity: Not on file  Other Topics Concern   Not on file  Social History Narrative   Not  on file   Social Determinants of Health   Financial Resource Strain: Not on file  Food Insecurity: Not on file  Transportation Needs: Not on file  Physical Activity: Not on file  Stress: Not on file  Social Connections: Not on file  Intimate Partner Violence: Not on file    No past surgical history on file.  No family history on file.  No Known Allergies  Current Outpatient Medications on File Prior to Visit  Medication Sig Dispense Refill   famotidine (PEPCID) 20 MG tablet Take 1 tablet (20 mg total) by mouth daily. 30 tablet 0   No current facility-administered medications on file prior to visit.    BP 118/60 (BP Location: Left Arm, Patient Position: Sitting, Cuff Size: Large)   Pulse 74   Temp 97.6 F (36.4 C) (Temporal)   Resp 18   Ht 5\' 11"  (1.803 m)   Wt 234 lb (106.1 kg)   SpO2 95%   BMI 32.64 kg/m        Objective:   Physical Exam  General Mental Status- Alert. General Appearance- Not in acute distress.   Skin General: Color- Normal Color. Moisture- Normal Moisture.  Neck Carotid Arteries- Normal color. Moisture- Normal Moisture. No carotid bruits. No JVD.  Chest and Lung Exam Auscultation: Breath Sounds:-Normal.  Cardiovascular Auscultation:Rythm- Regular. Murmurs & Other Heart Sounds:Auscultation of the heart  reveals- No Murmurs.  Abdomen Inspection:-Inspeection Normal. Palpation/Percussion:Note:No mass. Palpation and Percussion of the abdomen reveal- Non Tender, Non Distended + BS, no rebound or guarding.   Neurologic Cranial Nerve exam:- CN III-XII intact(No nystagmus), symmetric smile. Strength:- 5/5 equal and symmetric strength both upper and lower extremities.       Assessment & Plan:   Patient Instructions  For you wellness exam today I have ordered cbc, cmp and lipid panel.  [facility]accines flu thru [facility] when offered. Consider covid vaccine.  Placed referral for colonoscopy. Recommend that you call to get scheduled.   Recommend  exercise and healthy diet.  We will let you know lab results as they come in.  Follow up date appointment will be determined after lab review.       Esperanza Richters, PA-C

## 2022-12-04 ENCOUNTER — Encounter: Payer: 59 | Admitting: Medical

## 2023-05-15 ENCOUNTER — Emergency Department (HOSPITAL_BASED_OUTPATIENT_CLINIC_OR_DEPARTMENT_OTHER)
Admission: EM | Admit: 2023-05-15 | Discharge: 2023-05-15 | Disposition: A | Discharge status: Home / Self Care | Payer: Commercial Managed Care - PPO | Attending: Emergency Medicine | Admitting: Emergency Medicine

## 2023-05-15 ENCOUNTER — Encounter (HOSPITAL_BASED_OUTPATIENT_CLINIC_OR_DEPARTMENT_OTHER): Payer: Self-pay | Admitting: Emergency Medicine

## 2023-05-15 ENCOUNTER — Other Ambulatory Visit (HOSPITAL_COMMUNITY): Payer: Self-pay

## 2023-05-15 ENCOUNTER — Other Ambulatory Visit: Payer: Self-pay

## 2023-05-15 ENCOUNTER — Emergency Department (HOSPITAL_BASED_OUTPATIENT_CLINIC_OR_DEPARTMENT_OTHER): Payer: Commercial Managed Care - PPO

## 2023-05-15 DIAGNOSIS — R9389 Abnormal findings on diagnostic imaging of other specified body structures: Secondary | ICD-10-CM | POA: Diagnosis not present

## 2023-05-15 DIAGNOSIS — R0602 Shortness of breath: Secondary | ICD-10-CM | POA: Diagnosis not present

## 2023-05-15 DIAGNOSIS — J101 Influenza due to other identified influenza virus with other respiratory manifestations: Secondary | ICD-10-CM | POA: Diagnosis not present

## 2023-05-15 DIAGNOSIS — R059 Cough, unspecified: Secondary | ICD-10-CM | POA: Diagnosis not present

## 2023-05-15 DIAGNOSIS — R509 Fever, unspecified: Secondary | ICD-10-CM | POA: Diagnosis not present

## 2023-05-15 LAB — COMPREHENSIVE METABOLIC PANEL
ALT: 34 U/L (ref 0–44)
AST: 29 U/L (ref 15–41)
Albumin: 4.1 g/dL (ref 3.5–5.0)
Alkaline Phosphatase: 59 U/L (ref 38–126)
Anion gap: 11 (ref 5–15)
BUN: 8 mg/dL (ref 6–20)
CO2: 26 mmol/L (ref 22–32)
Calcium: 9.2 mg/dL (ref 8.9–10.3)
Chloride: 99 mmol/L (ref 98–111)
Creatinine, Ser: 1.2 mg/dL (ref 0.61–1.24)
GFR, Estimated: >60 mL/min (ref >60)
Glucose, Bld: 123 mg/dL — ABNORMAL HIGH (ref 70–99)
Potassium: 3.7 mmol/L (ref 3.5–5.1)
Sodium: 136 mmol/L (ref 135–145)
Total Bilirubin: 1.2 mg/dL (ref 0.0–1.2)
Total Protein: 7.6 g/dL (ref 6.5–8.1)

## 2023-05-15 LAB — TROPONIN I (HIGH SENSITIVITY)
Troponin I (High Sensitivity): 4 ng/L (ref <18)
Troponin I (High Sensitivity): 4 ng/L (ref <18)

## 2023-05-15 LAB — CBC WITH DIFFERENTIAL/PLATELET
Abs Immature Granulocytes: 0.02 10*3/uL (ref 0.00–0.07)
Basophils Absolute: 0 10*3/uL (ref 0.0–0.1)
Basophils Relative: 0 %
Eosinophils Absolute: 0 10*3/uL (ref 0.0–0.5)
Eosinophils Relative: 0 %
HCT: 46.9 % (ref 39.0–52.0)
Hemoglobin: 15.9 g/dL (ref 13.0–17.0)
Immature Granulocytes: 0 %
Lymphocytes Relative: 11 %
Lymphs Abs: 0.8 10*3/uL (ref 0.7–4.0)
MCH: 29.4 pg (ref 26.0–34.0)
MCHC: 33.9 g/dL (ref 30.0–36.0)
MCV: 86.7 fL (ref 80.0–100.0)
Monocytes Absolute: 0.8 10*3/uL (ref 0.1–1.0)
Monocytes Relative: 11 %
Neutro Abs: 5.8 10*3/uL (ref 1.7–7.7)
Neutrophils Relative %: 78 %
Platelets: 275 10*3/uL (ref 150–400)
RBC: 5.41 MIL/uL (ref 4.22–5.81)
RDW: 14.4 % (ref 11.5–15.5)
WBC: 7.4 10*3/uL (ref 4.0–10.5)
nRBC: 0 % (ref 0.0–0.2)

## 2023-05-15 LAB — RESP PANEL BY RT-PCR (RSV, FLU A&B, COVID)  RVPGX2
Influenza A by PCR: POSITIVE — AB
Influenza B by PCR: NEGATIVE
Resp Syncytial Virus by PCR: NEGATIVE
SARS Coronavirus 2 by RT PCR: NEGATIVE

## 2023-05-15 LAB — LACTIC ACID, PLASMA: Lactic Acid, Venous: 1.8 mmol/L (ref 0.5–1.9)

## 2023-05-15 MED ORDER — NAPROXEN 500 MG PO TABS
500.0000 mg | ORAL_TABLET | Freq: Two times a day (BID) | ORAL | 0 refills | Status: AC
  Filled 2023-05-15: qty 30, 15d supply, fill #0

## 2023-05-15 MED ORDER — SODIUM CHLORIDE 0.9 % IV BOLUS
1000.0000 mL | Freq: Once | INTRAVENOUS | Status: AC
  Administered 2023-05-15: 1000 mL via INTRAVENOUS

## 2023-05-15 MED ORDER — ONDANSETRON HCL 4 MG/2ML IJ SOLN
4.0000 mg | Freq: Once | INTRAMUSCULAR | Status: AC
  Administered 2023-05-15: 4 mg via INTRAVENOUS
  Filled 2023-05-15: qty 2

## 2023-05-15 MED ORDER — KETOROLAC TROMETHAMINE 15 MG/ML IJ SOLN
15.0000 mg | Freq: Once | INTRAMUSCULAR | Status: AC
  Administered 2023-05-15: 15 mg via INTRAVENOUS
  Filled 2023-05-15: qty 1

## 2023-05-15 MED ORDER — OSELTAMIVIR PHOSPHATE 75 MG PO CAPS
75.0000 mg | ORAL_CAPSULE | Freq: Two times a day (BID) | ORAL | 0 refills | Status: AC
  Filled 2023-05-15: qty 10, 5d supply, fill #0

## 2023-05-15 MED ORDER — ACETAMINOPHEN 500 MG PO TABS
1000.0000 mg | ORAL_TABLET | Freq: Once | ORAL | Status: AC
Start: 2023-05-15 — End: 2023-05-15
  Administered 2023-05-15: 1000 mg via ORAL
  Filled 2023-05-15: qty 2

## 2023-05-15 MED ORDER — ONDANSETRON 4 MG PO TBDP
4.0000 mg | ORAL_TABLET | Freq: Three times a day (TID) | ORAL | 0 refills | Status: AC | PRN
  Filled 2023-05-15: qty 20, 7d supply, fill #0

## 2023-05-15 MED ORDER — ONDANSETRON 4 MG PO TBDP
4.0000 mg | ORAL_TABLET | Freq: Once | ORAL | Status: AC
  Administered 2023-05-15: 4 mg via ORAL
  Filled 2023-05-15: qty 1

## 2023-05-15 NOTE — ED Triage Notes (Signed)
 Pt to ED from home SOB that is worse with lying down, states cough and fever, generalized tightness across chest, and also c/o trouble sleeping that worsening earlier today.  Last took tylenol around 1300.

## 2023-05-15 NOTE — ED Provider Notes (Signed)
 [facility] Emergency Department Provider Note MRN:  [id number]  Arrival date & time: 05/15/23     Chief Complaint   Shortness of Breath   History of Present Illness   Dale Harper is a 48 y.o. year-old male with no pertinent past medical history presenting to the ED with chief complaint of shortness of breath.  Malaise fatigue shortness of breath chest pain with cough, chest congestion, headache, body aches.  Symptoms for 4 days.  Review of Systems  A thorough review of systems was obtained and all systems are negative except as noted in the HPI and PMH.   Patient's Health History   History reviewed. No pertinent past medical history.  History reviewed. No pertinent surgical history.  History reviewed. No pertinent family history.  Social History   Socioeconomic History   Marital status: Widowed    Spouse name: Not on file   Number of children: Not on file   Years of education: Not on file   Highest education level: Not on file  Occupational History   Not on file  Tobacco Use   Smoking status: Never   Smokeless tobacco: Never  Vaping Use   Vaping status: Never Used  Substance and Sexual Activity   Alcohol use: Yes    Alcohol/week: 1.0 standard drink of alcohol    Types: 1 Glasses of wine per week    Comment: every 6 monhts.   Drug use: No   Sexual activity: Not on file  Other Topics Concern   Not on file  Social History Narrative   Not on file   Social Drivers of Health   Financial Resource Strain: Not on file  Food Insecurity: Not on file  Transportation Needs: Not on file  Physical Activity: Not on file  Stress: Not on file  Social Connections: Not on file  Intimate Partner Violence: Not on file     Physical Exam   Vitals:   05/15/23 0339 05/15/23 0415  BP: (!) 142/92 (!) 149/97  Pulse: (!) 105 (!) 105  Resp: 16 16  Temp: 98.9 F (37.2 C)   SpO2: 96% 96%    CONSTITUTIONAL: Well-appearing, NAD NEURO/PSYCH:   Alert and oriented x 3, no focal deficits EYES:  eyes equal and reactive ENT/NECK:  no LAD, no JVD CARDIO: Tachycardic rate, well-perfused, normal S1 and S2 PULM:  CTAB no wheezing or rhonchi GI/GU:  non-distended, non-tender MSK/SPINE:  No gross deformities, no edema SKIN:  no rash, atraumatic   *Additional and/or pertinent findings included in MDM below  Diagnostic and Interventional Summary    EKG Interpretation Date/Time:    Ventricular Rate:    PR Interval:    QRS Duration:    QT Interval:    QTC Calculation:   R Axis:      Text Interpretation:         Labs Reviewed  RESP PANEL BY RT-PCR (RSV, FLU A&B, COVID)  RVPGX2 - Abnormal; Notable for the following components:      Result Value   Influenza A by PCR POSITIVE (*)    All other components within normal limits  COMPREHENSIVE METABOLIC PANEL - Abnormal; Notable for the following components:   Glucose, Bld 123 (*)    All other components within normal limits  LACTIC ACID, PLASMA  CBC WITH DIFFERENTIAL/PLATELET  TROPONIN I (HIGH SENSITIVITY)  TROPONIN I (HIGH SENSITIVITY)    DG Chest 2 View  Final Result      Medications  acetaminophen (TYLENOL)  tablet 1,000 mg (1,000 mg Oral Given 05/15/23 0154)  ondansetron (ZOFRAN-ODT) disintegrating tablet 4 mg (4 mg Oral Given 05/15/23 0149)  sodium chloride 0.9 % bolus 1,000 mL (0 mLs Intravenous Stopped 05/15/23 0429)  ketorolac (TORADOL) 15 MG/ML injection 15 mg (15 mg Intravenous Given 05/15/23 0335)  ondansetron (ZOFRAN) injection 4 mg (4 mg Intravenous Given 05/15/23 0335)     Procedures  /  Critical Care Procedures  ED Course and Medical Decision Making  Initial Impression and Ddx Symptoms suggestive of viral illness, likely COVID-19 or flu.  Patient is tachycardic up to 130, possibly dehydrated, providing fluids and will reassess.  Past medical/surgical history that increases complexity of ED encounter: None  Interpretation of Diagnostics I personally reviewed  the laboratory assessment and my interpretation is as follows: No significant blood count or electrolyte disturbance  Troponin negative x 2.  Flu positive.  Patient Reassessment and Ultimate Disposition/Management     After fluids patient is feeling better, tachycardia resolved, appropriate for discharge.  Patient management required discussion with the following services or consulting groups:  None  Complexity of Problems Addressed Acute illness or injury that poses threat of life of bodily function  Additional Data Reviewed and Analyzed Further history obtained from: Further history from spouse/family member  Additional Factors Impacting ED Encounter Risk Prescriptions and Consideration of hospitalization  Elmer Sow. Pilar Plate, MD [facility] Emergency Medicine [facility] [email or web address]  Final Clinical Impressions(s) / ED Diagnoses     ICD-10-CM   1. Influenza A  J10.1       ED Discharge Orders          Ordered    ondansetron (ZOFRAN-ODT) 4 MG disintegrating tablet  Every 8 hours PRN        05/15/23 0437    oseltamivir (TAMIFLU) 75 MG capsule  Every 12 hours        05/15/23 0437    naproxen (NAPROSYN) 500 MG tablet  2 times daily        05/15/23 6045             Discharge Instructions Discussed with and Provided to Patient:     Discharge Instructions      You were evaluated in the Emergency Department and after careful evaluation, we did not find any emergent condition requiring admission or further testing in the hospital.  Your exam/testing today is overall reassuring.  Symptoms likely due to the flu.  You tested positive here in the emergency department.  Can use the Zofran as needed for nausea, use the Naprosyn as needed for fever or discomfort, can use the Tamiflu to help you get better faster.  Plenty of fluids and rest.  Please return to the Emergency Department if you experience any worsening of your condition.   Thank you for  allowing [place] to be a part of your care.       Sabas Sous, MD 05/15/23 [id number]

## 2023-05-15 NOTE — Discharge Instructions (Signed)
 You were evaluated in the Emergency Department and after careful evaluation, we did not find any emergent condition requiring admission or further testing in the hospital.  Your exam/testing today is overall reassuring.  Symptoms likely due to the flu.  You tested positive here in the emergency department.  Can use the Zofran as needed for nausea, use the Naprosyn as needed for fever or discomfort, can use the Tamiflu to help you get better faster.  Plenty of fluids and rest.  Please return to the Emergency Department if you experience any worsening of your condition.   Thank you for allowing [place] to be a part of your care.

## 2023-05-15 NOTE — ED Notes (Signed)
 ED Provider at bedside.

## 2023-09-11 ENCOUNTER — Emergency Department (HOSPITAL_BASED_OUTPATIENT_CLINIC_OR_DEPARTMENT_OTHER): Admission: EM | Admit: 2023-09-11 | Discharge: 2023-09-11 | Disposition: A | Discharge status: Home / Self Care

## 2023-09-11 ENCOUNTER — Encounter (HOSPITAL_BASED_OUTPATIENT_CLINIC_OR_DEPARTMENT_OTHER): Payer: Self-pay | Admitting: *Deleted

## 2023-09-11 ENCOUNTER — Other Ambulatory Visit: Payer: Self-pay

## 2023-09-11 ENCOUNTER — Other Ambulatory Visit (HOSPITAL_BASED_OUTPATIENT_CLINIC_OR_DEPARTMENT_OTHER): Payer: Self-pay

## 2023-09-11 ENCOUNTER — Emergency Department (HOSPITAL_BASED_OUTPATIENT_CLINIC_OR_DEPARTMENT_OTHER)

## 2023-09-11 DIAGNOSIS — R000 Tachycardia, unspecified: Secondary | ICD-10-CM | POA: Diagnosis not present

## 2023-09-11 DIAGNOSIS — R062 Wheezing: Secondary | ICD-10-CM | POA: Insufficient documentation

## 2023-09-11 DIAGNOSIS — R051 Acute cough: Secondary | ICD-10-CM | POA: Diagnosis not present

## 2023-09-11 DIAGNOSIS — R0789 Other chest pain: Secondary | ICD-10-CM | POA: Diagnosis not present

## 2023-09-11 DIAGNOSIS — R0602 Shortness of breath: Secondary | ICD-10-CM | POA: Diagnosis not present

## 2023-09-11 DIAGNOSIS — R059 Cough, unspecified: Secondary | ICD-10-CM | POA: Diagnosis not present

## 2023-09-11 DIAGNOSIS — J9811 Atelectasis: Secondary | ICD-10-CM | POA: Diagnosis not present

## 2023-09-11 LAB — BASIC METABOLIC PANEL WITH GFR
Anion gap: 12 (ref 5–15)
BUN: 11 mg/dL (ref 6–20)
CO2: 28 mmol/L (ref 22–32)
Calcium: 9.5 mg/dL (ref 8.9–10.3)
Chloride: 102 mmol/L (ref 98–111)
Creatinine, Ser: 1.15 mg/dL (ref 0.61–1.24)
GFR, Estimated: >60 mL/min (ref >60)
Glucose, Bld: 120 mg/dL — ABNORMAL HIGH (ref 70–99)
Potassium: 3.6 mmol/L (ref 3.5–5.1)
Sodium: 142 mmol/L (ref 135–145)

## 2023-09-11 LAB — D-DIMER, QUANTITATIVE: D-Dimer, Quant: 0.36 ug{FEU}/mL (ref 0.00–0.50)

## 2023-09-11 LAB — CBC WITH DIFFERENTIAL/PLATELET
Abs Immature Granulocytes: 0.01 10*3/uL (ref 0.00–0.07)
Basophils Absolute: 0 10*3/uL (ref 0.0–0.1)
Basophils Relative: 0 %
Eosinophils Absolute: 0.3 10*3/uL (ref 0.0–0.5)
Eosinophils Relative: 5 %
HCT: 44.7 % (ref 39.0–52.0)
Hemoglobin: 15 g/dL (ref 13.0–17.0)
Immature Granulocytes: 0 %
Lymphocytes Relative: 25 %
Lymphs Abs: 1.7 10*3/uL (ref 0.7–4.0)
MCH: 29.5 pg (ref 26.0–34.0)
MCHC: 33.6 g/dL (ref 30.0–36.0)
MCV: 88 fL (ref 80.0–100.0)
Monocytes Absolute: 0.8 10*3/uL (ref 0.1–1.0)
Monocytes Relative: 11 %
Neutro Abs: 4.2 10*3/uL (ref 1.7–7.7)
Neutrophils Relative %: 59 %
Platelets: 289 10*3/uL (ref 150–400)
RBC: 5.08 MIL/uL (ref 4.22–5.81)
RDW: 14 % (ref 11.5–15.5)
WBC: 7 10*3/uL (ref 4.0–10.5)
nRBC: 0 % (ref 0.0–0.2)

## 2023-09-11 LAB — TROPONIN T, HIGH SENSITIVITY: Troponin T High Sensitivity: <15 ng/L (ref <19)

## 2023-09-11 LAB — PRO BRAIN NATRIURETIC PEPTIDE: Pro Brain Natriuretic Peptide: <36 pg/mL (ref <300.0)

## 2023-09-11 MED ORDER — PREDNISONE 20 MG PO TABS
40.0000 mg | ORAL_TABLET | Freq: Every day | ORAL | 0 refills | Status: DC
  Filled 2023-09-11: qty 10, 5d supply, fill #0

## 2023-09-11 MED ORDER — BENZONATATE 100 MG PO CAPS
100.0000 mg | ORAL_CAPSULE | Freq: Three times a day (TID) | ORAL | 0 refills | Status: AC
  Filled 2023-09-11: qty 15, 5d supply, fill #0

## 2023-09-11 MED ORDER — ALBUTEROL SULFATE (2.5 MG/3ML) 0.083% IN NEBU
5.0000 mg | INHALATION_SOLUTION | Freq: Once | RESPIRATORY_TRACT | Status: AC
  Administered 2023-09-11: 5 mg via RESPIRATORY_TRACT
  Filled 2023-09-11: qty 6

## 2023-09-11 MED ORDER — ALBUTEROL SULFATE HFA 108 (90 BASE) MCG/ACT IN AERS
2.0000 | INHALATION_SPRAY | RESPIRATORY_TRACT | Status: DC | PRN
  Administered 2023-09-11: 2 via RESPIRATORY_TRACT
  Filled 2023-09-11: qty 6.7

## 2023-09-11 MED ORDER — IPRATROPIUM BROMIDE 0.02 % IN SOLN
0.5000 mg | Freq: Once | RESPIRATORY_TRACT | Status: AC
Start: 2023-09-11 — End: 2023-09-11
  Administered 2023-09-11: 0.5 mg via RESPIRATORY_TRACT
  Filled 2023-09-11: qty 2.5

## 2023-09-11 NOTE — ED Notes (Signed)
 Patient ambulated with pulse ox. SAT 91-92% entire time. PA made aware

## 2023-09-11 NOTE — Discharge Instructions (Signed)
 Please read and follow all provided instructions.  Your diagnoses today include:  1. Acute cough   2. Wheezing   3. Sinus tachycardia     Tests performed today include: An EKG of your heart: Shows fast heart rate A chest x-ray: No sign of pneumonia or infection Cardiac enzymes - a blood test for heart muscle damage, was normal Blood counts and electrolytes: Blood sugar was slightly high but otherwise normal Screening test for blood clot and for heart failure: Were normal Vital signs. See below for your results today.   Medications prescribed:  Albuterol  inhaler - medication that opens up your airway  Use inhaler as follows: 1-2 puffs with spacer every 4 hours as needed for wheezing, cough, or shortness of breath.   Tessalon  Perles - cough suppressant medication  Prednisone - steroid medicine   It is best to take this medication in the morning to prevent sleeping problems. If you are diabetic, monitor your blood sugar closely and stop taking Prednisone if blood sugar is over 300. Take with food to prevent stomach upset.   Take any prescribed medications only as directed.  Follow-up instructions: Please follow-up with your primary care provider in 3 days for further evaluation of your symptoms.   Return instructions:  SEEK IMMEDIATE MEDICAL ATTENTION IF: You have severe chest pain, especially if the pain is crushing or pressure-like and spreads to the arms, back, neck, or jaw, or if you have sweating, nausea or vomiting, or trouble with breathing. THIS IS AN EMERGENCY. Do not wait to see if the pain will go away. Get medical help at once. Call 911. DO NOT drive yourself to the hospital.  Your chest pain gets worse and does not go away after a few minutes of rest.  You have an attack of chest pain lasting longer than what you usually experience.  You have significant dizziness, if you pass out, or have trouble walking.  You have chest pain not typical of your usual pain for which  you originally saw your caregiver.  You have any other emergent concerns regarding your health.  Your vital signs today were: BP (!) 141/95   Pulse (!) 112   Temp 99 F (37.2 C)   Resp 20   SpO2 92%  If your blood pressure (BP) was elevated above 135/85 this visit, please have this repeated by your doctor within one month. --------------

## 2023-09-11 NOTE — ED Provider Notes (Signed)
 [place] [facility] Provider Note   CSN: [id number] Arrival date & time: 09/11/23  8469     Patient presents with: Cough   Dale Harper is a 48 y.o. male.   Patient with history of pneumonia presents to the emergency department today for evaluation of cough.  Patient states that he has had coughing spells over the past 8 days.  He has had a tightness in his chest as well.  No fevers.  He has had a sore throat but no ear pain or runny nose.  No chest pain.  Patient states that he had a coughing spell over the weekend (3 days ago) and he passed out briefly during the spell.  Denies history of asthma or tobacco use. Patient denies risk factors for pulmonary embolism including: unilateral leg swelling, history of DVT/PE/other blood clots, use of exogenous hormones, recent immobilizations, recent surgery, recent travel (>4hr segment), malignancy, hemoptysis.          Prior to Admission medications   Medication Sig Start Date End Date Taking? Authorizing Provider  famotidine  (PEPCID ) 20 MG tablet Take 1 tablet (20 mg total) by mouth daily. 10/29/20   Saguier, Gaylin Ke, PA-C  naproxen  (NAPROSYN ) 500 MG tablet Take 1 tablet (500 mg total) by mouth 2 (two) times daily. 05/15/23   Edson Graces, MD  ondansetron  (ZOFRAN -ODT) 4 MG disintegrating tablet Take 1 tablet (4 mg total) by mouth every 8 (eight) hours as needed for nausea or vomiting. 05/15/23   Edson Graces, MD    Allergies: Patient has no known allergies.    Review of Systems  Updated Vital Signs BP (!) 141/100   Pulse (!) 111   Temp 99 F (37.2 C) (Oral)   Resp (!) 22   SpO2 96%   Physical Exam Vitals and nursing note reviewed.  Constitutional:      Appearance: He is well-developed.  HENT:     Head: Normocephalic and atraumatic.     Jaw: No trismus.     Right Ear: Tympanic membrane, ear canal and external ear normal.     Left Ear: Tympanic membrane, ear canal and external ear  normal.     Nose: Nose normal. No mucosal edema or rhinorrhea.     Mouth/Throat:     Mouth: Mucous membranes are not dry.     Pharynx: Uvula midline. No oropharyngeal exudate, posterior oropharyngeal erythema or uvula swelling.     Tonsils: No tonsillar abscesses.   Eyes:     General:        Right eye: No discharge.        Left eye: No discharge.     Conjunctiva/sclera: Conjunctivae normal.    Cardiovascular:     Rate and Rhythm: Regular rhythm. Tachycardia present.     Heart sounds: Normal heart sounds.     Comments: Tachycardic to about 110. Pulmonary:     Effort: Pulmonary effort is normal. No respiratory distress.     Breath sounds: Wheezing present. No rales.     Comments: Patient with scattered wheezing throughout lung fields, also some upper airway wheezing with expiration. Abdominal:     Palpations: Abdomen is soft.     Tenderness: There is no abdominal tenderness.   Musculoskeletal:        General: No tenderness.     Cervical back: Normal range of motion and neck supple.     Right lower leg: No edema.     Left lower leg: No  edema.   Skin:    General: Skin is warm and dry.   Neurological:     Mental Status: He is alert.    ED Course  Patient seen and examined. History obtained directly from patient.   Labs/EKG: EKG personally reviewed and interpreted as below.  Very similar to February 2025 when patient was diagnosed with influenza.  Imaging: Ordered chest x-ray  Medications/Fluids: Ordered: Albuterol /Atrovent.   Most recent vital signs reviewed and are as follows: BP (!) 141/100   Pulse (!) 111   Temp 99 F (37.2 C) (Oral)   Resp (!) 22   SpO2 96%   Initial impression: Cough and wheeze, no chest pain.  No clinical signs of DVT.   11:22 AM Reassessment performed. Patient appears stable.  He does not feel better or worse with breathing treatment.  Patient did ambulate on pulse ox 91-92%.  He was able to hold a conversation.  Heart rate still elevated,  although this is after albuterol .  Given persistent tachycardia, borderline oxygen saturations, discussed with patient about further workup.  I will order lab work.  This will include BNP and troponin and D-dimer.  Discussed need for CT if D-dimer is positive.  Imaging personally visualized and interpreted including: Chest x-ray, gree no infiltrate  Reviewed pertinent lab work and imaging with patient at bedside. Questions answered.   Most current vital signs reviewed and are as follows: BP (!) 147/98   Pulse (!) 124   Temp 99 F (37.2 C)   Resp 19   SpO2 92%   Plan: Labs to evaluate for any sign of ACS, CHF, PE.  12:20 PM Reassessment performed. Patient appears comfortable.  Heart rate around 110, oxygen 94%.  Labs personally reviewed and interpreted including: CBC was unremarkable; BMP glucose 120 otherwise unremarkable; BNP, troponin, D-dimer all negative.  Reviewed pertinent lab work and imaging with patient at bedside. Questions answered.   Most current vital signs reviewed and are as follows: BP (!) 141/95   Pulse (!) 112   Temp 99 F (37.2 C)   Resp 20   SpO2 92%   Plan: Discharge to home.  Will provide with albuterol  inhaler to use every 4 hours as needed for cough, chest tightness, wheezing.  Prescriptions written for: Prednisone, Tessalon   Other home care instructions discussed: Avoidance of triggers  ED return instructions discussed: Return with worsening shortness of breath, development of chest pain, fever, new or worsening symptoms.  I encouraged patient to return to ED with severe chest pain, especially if the pain is crushing or pressure-like and spreads to the arms, back, neck, or jaw, or if they have associated sweating, vomiting, or shortness of breath with the pain, or significant pain with activity. We discussed that the evaluation here today indicates a low-risk of serious cause of chest pain, including heart trouble or a blood clot, but no evaluation is  perfect and chest pain can evolve with time.   Follow-up instructions discussed: Patient encouraged to follow-up with their PCP in 3 days for recheck of symptoms, recheck of heart rate.  Patient may need additional evaluation if he continues to have sinus tachycardia.    (all labs ordered are listed, but only abnormal results are displayed) Labs Reviewed - No data to display  EKG: EKG Interpretation Date/Time:  Tuesday September 11 2023 09:18:01 EDT Ventricular Rate:  104 PR Interval:  153 QRS Duration:  75 QT Interval:  322 QTC Calculation: 424 R Axis:   47  Text Interpretation:  Sinus tachycardia Borderline T abnormalities, anterior leads Confirmed by Abner Hoffman [phone]) on 09/11/2023 9:21:27 AM  Radiology: DG Chest 2 View Result Date: 09/11/2023 CLINICAL DATA:  Cough for 8 days EXAM: CHEST - 2 VIEW COMPARISON:  X-ray 05/15/2023 and older FINDINGS: Eventration of left hemidiaphragm. Underinflation. There is some linear opacity at the left lung base likely scar or atelectasis. No consolidation, pneumothorax or effusion. No edema. Overlapping cardiac leads. Degenerative changes of the spine. IMPRESSION: Underinflation.  Basilar atelectasis. Electronically Signed   By: Adrianna Horde M.D.   On: 09/11/2023 11:13     Procedures   Medications Ordered in the ED  albuterol  (VENTOLIN  HFA) 108 (90 Base) MCG/ACT inhaler 2 puff (has no administration in time range)  albuterol  (PROVENTIL ) (2.5 MG/3ML) 0.083% nebulizer solution 5 mg (5 mg Nebulization Given 09/11/23 0932)  ipratropium (ATROVENT) nebulizer solution 0.5 mg (0.5 mg Nebulization Given 09/11/23 0932)                                    Medical Decision Making Amount and/or Complexity of Data Reviewed Labs: ordered. Radiology: ordered.  Risk Prescription drug management.   Patient presents to the emergency department today for evaluation of cough.  Patient had wheezing on arrival as well.  He was noted to have sinus tachycardia,  normal, but borderline low oxygen saturations.  He had minimal improvement after albuterol .  Given this, lab work was ordered and and all is reassuring.  Low risk Wells, negative D-dimer.  Troponin negative, BNP negative for heart failure.  Patient has been stable during 3 hours in the emergency department.  Will treat cough and bronchospasm.  Strongly encourage PCP follow-up to have vitals rechecked and symptoms rechecked.  We discussed signs and symptoms which should cause him to return.  Clinically, he appears well, no distress.       Final diagnoses:  Acute cough  Wheezing  Sinus tachycardia    ED Discharge Orders          Ordered    benzonatate  (TESSALON ) 100 MG capsule  Every 8 hours        09/11/23 1218    predniSONE (DELTASONE) 20 MG tablet  Daily        09/11/23 1218               Lyna Sandhoff, PA-C 09/11/23 1224    Carin Charleston, MD 09/11/23 1427

## 2023-09-11 NOTE — ED Notes (Signed)
 Patient transported to X-ray

## 2023-09-11 NOTE — ED Triage Notes (Signed)
 Pt has had a cough since last Monday and this morning he had a coughing attack which made him feel like he was chocking.  Pt is ambulatory to room without distress and speaking in full sentences.

## 2023-09-18 ENCOUNTER — Ambulatory Visit: Payer: Self-pay | Admitting: Medical

## 2023-09-18 ENCOUNTER — Encounter: Payer: Self-pay | Admitting: Medical

## 2023-09-18 ENCOUNTER — Ambulatory Visit (HOSPITAL_BASED_OUTPATIENT_CLINIC_OR_DEPARTMENT_OTHER)
Admission: RE | Admit: 2023-09-18 | Discharge: 2023-09-18 | Disposition: A | Discharge status: Home / Self Care | Source: Ambulatory Visit | Attending: Medical | Admitting: Medical

## 2023-09-18 ENCOUNTER — Other Ambulatory Visit (HOSPITAL_BASED_OUTPATIENT_CLINIC_OR_DEPARTMENT_OTHER): Payer: Self-pay

## 2023-09-18 ENCOUNTER — Ambulatory Visit: Admitting: Medical

## 2023-09-18 VITALS — BP 122/84 | HR 99 | Temp 98.2°F | Resp 18 | Ht 71.0 in | Wt 239.0 lb

## 2023-09-18 DIAGNOSIS — K449 Diaphragmatic hernia without obstruction or gangrene: Secondary | ICD-10-CM | POA: Diagnosis not present

## 2023-09-18 DIAGNOSIS — J4540 Moderate persistent asthma, uncomplicated: Secondary | ICD-10-CM

## 2023-09-18 DIAGNOSIS — R739 Hyperglycemia, unspecified: Secondary | ICD-10-CM | POA: Diagnosis not present

## 2023-09-18 DIAGNOSIS — R059 Cough, unspecified: Secondary | ICD-10-CM | POA: Insufficient documentation

## 2023-09-18 DIAGNOSIS — R0989 Other specified symptoms and signs involving the circulatory and respiratory systems: Secondary | ICD-10-CM | POA: Diagnosis not present

## 2023-09-18 LAB — HEMOGLOBIN A1C: Hgb A1c MFr Bld: 6.9 % — ABNORMAL HIGH (ref 4.6–6.5)

## 2023-09-18 MED ORDER — METHYLPREDNISOLONE 4 MG PO TABS
ORAL_TABLET | ORAL | 0 refills | Status: AC
  Filled 2023-09-18: qty 21, 6d supply, fill #0

## 2023-09-18 MED ORDER — ALBUTEROL SULFATE HFA 108 (90 BASE) MCG/ACT IN AERS
2.0000 | INHALATION_SPRAY | Freq: Four times a day (QID) | RESPIRATORY_TRACT | 0 refills | Status: AC | PRN
  Filled 2023-09-18: qty 6.7, 19d supply, fill #0

## 2023-09-18 MED ORDER — METFORMIN HCL 500 MG PO TABS
500.0000 mg | ORAL_TABLET | Freq: Two times a day (BID) | ORAL | 3 refills | Status: DC
  Filled 2023-09-18: qty 180, 90d supply, fill #0

## 2023-09-18 MED ORDER — FLUTICASONE-SALMETEROL 100-50 MCG/ACT IN AEPB
1.0000 | INHALATION_SPRAY | Freq: Two times a day (BID) | RESPIRATORY_TRACT | 3 refills | Status: AC
  Filled 2023-09-18: qty 60, 30d supply, fill #0
  Filled 2024-01-02: qty 60, 30d supply, fill #1
  Filled 2024-03-13: qty 60, 30d supply, fill #2

## 2023-09-18 NOTE — Progress Notes (Signed)
 Subjective:    Patient ID: Dale Harper, male    DOB: [birth year], 48 y.o.   MRN: [id number]  HPI Recent ED visit summarized below.  Patient presents with: Cough     Dale Harper is a 48 y.o. male.     Patient with history of pneumonia presents to the emergency department today for evaluation of cough.  Patient states that he has had coughing spells over the past 8 days.  He has had a tightness in his chest as well.  No fevers.  He has had a sore throat but no ear Harper or runny nose.  No chest Harper.  Patient states that he had a coughing spell over the weekend (3 days ago) and he passed out briefly during the spell.  Denies history of asthma or tobacco use. Patient denies risk factors for pulmonary embolism including: unilateral leg swelling, history of DVT/PE/other blood clots, use of exogenous hormones, recent immobilizations, recent surgery, recent travel (>4hr segment), malignancy, hemoptysis.    Pulmonary:     Effort: Pulmonary effort is normal. No respiratory distress.     Breath sounds: Wheezing present. No rales.     Comments: Patient with scattered wheezing throughout lung fields, also some upper airway wheezing with expiration.   Prescriptions written for: Prednisone , Tessalon  (and pt states albuterol  inhaler given)   Other home care instructions discussed: Avoidance of triggers   ED return instructions discussed: Return with worsening shortness of breath, development of chest Harper, fever, new or worsening symptoms.  I encouraged patient to return to ED with severe chest Harper, especially if the Harper is crushing or pressure-like and spreads to the arms, back, neck, or jaw, or if they have associated sweating, vomiting, or shortness of breath with the Harper, or significant Harper with activity. We discussed that the evaluation here today indicates a low-risk of serious cause of chest Harper, including heart trouble or a blood clot, but no evaluation is perfect and chest Harper  can evolve with time.    Follow-up instructions discussed: Patient encouraged to follow-up with their PCP in 3 days for recheck of symptoms, recheck of heart rate.  Patient may need additional evaluation if he continues to have sinus tachycardia.      Dale Harper is a 48 year old male with asthma who presents with persistent cough and wheezing.  He began experiencing a persistent cough on September 03, 2023, which worsened over the following week, leading to a brief loss of consciousness. Initial self-treatment with allergy and cold medications was ineffective.  On September 11, 2023, he visited the emergency department and was diagnosed with bronchitis. He was prescribed a prednisone  taper, an albuterol  inhaler, and benzonatate  for cough. He completed the prednisone  course by September 16, 2023, and continues to use the albuterol  inhaler every four to six hours to manage symptoms.  He has a history of asthma during childhood, which had not been problematic until the recent onset of symptoms. No recent fever, chills, or productive cough. He experiences wheezing and bronchial spasms, particularly when lying down, with some improvement in symptoms but not full resolution.  Overall does report much better but not completely better.  He works as a Engineer, materials at [employer], primarily indoors, and occasionally goes outside for fresh air but does not frequently work in humid conditions.  He has a family history of diabetes, as his father has the condition.   Review of Systems  Constitutional:  Negative for appetite change, diaphoresis  and fatigue.  HENT:  Negative for congestion, ear discharge and postnasal drip.   Respiratory:  Positive for wheezing. Negative for cough and chest tightness.        Better but still wheezing intermittently.  Cardiovascular:  Negative for chest Harper and palpitations.  Gastrointestinal:  Negative for abdominal Harper, constipation and diarrhea.  Musculoskeletal:   Negative for back Harper and myalgias.  Skin:  Negative for rash.  Neurological:  Negative for dizziness, speech difficulty and light-headedness.  Hematological:  Negative for adenopathy. Does not bruise/bleed easily.  Psychiatric/Behavioral:  Negative for behavioral problems and dysphoric mood. The patient is not nervous/anxious.      No past medical history on file.   Social History   Socioeconomic History   Marital status: Widowed    Spouse name: Not on file   Number of children: Not on file   Years of education: Not on file   Highest education level: Not on file  Occupational History   Not on file  Tobacco Use   Smoking status: Never   Smokeless tobacco: Never  Vaping Use   Vaping status: Never Used  Substance and Sexual Activity   Alcohol use: Yes    Alcohol/week: 1.0 standard drink of alcohol    Types: 1 Glasses of wine per week    Comment: every 6 monhts.   Drug use: No   Sexual activity: Not on file  Other Topics Concern   Not on file  Social History Narrative   Not on file   Social Drivers of Health   Financial Resource Strain: Not on file  Food Insecurity: Not on file  Transportation Needs: Not on file  Physical Activity: Not on file  Stress: Not on file  Social Connections: Not on file  Intimate Partner Violence: Not on file    No past surgical history on file.  No family history on file.  No Known Allergies  Current Outpatient Medications on File Prior to Visit  Medication Sig Dispense Refill   benzonatate  (TESSALON ) 100 MG capsule Take 1 capsule (100 mg total) by mouth every 8 (eight) hours. 15 capsule 0   famotidine  (PEPCID ) 20 MG tablet Take 1 tablet (20 mg total) by mouth daily. 30 tablet 0   naproxen  (NAPROSYN ) 500 MG tablet Take 1 tablet (500 mg total) by mouth 2 (two) times daily. 30 tablet 0   ondansetron  (ZOFRAN -ODT) 4 MG disintegrating tablet Take 1 tablet (4 mg total) by mouth every 8 (eight) hours as needed for nausea or vomiting. 20  tablet 0   No current facility-administered medications on file prior to visit.    BP 122/84   Pulse 99   Temp 98.2 F (36.8 C)   Resp 18   Ht 5' 11 (1.803 m)   Wt 239 lb (108.4 kg)   SpO2 95%   BMI 33.33 kg/m        Objective:   Physical Exam  General Mental Status- Alert. General Appearance- Not in acute distress.   Skin General: Color- Normal Color. Moisture- Normal Moisture.  Neck Carotid Arteries- Normal color. Moisture- Normal Moisture. No carotid bruits. No JVD.  Chest and Lung Exam Auscultation: Breath Sounds:-even and unlabored but mild decreased respiration bilaterally. Faint rough breath sounds rt lung base  Cardiovascular Auscultation:Rythm- Regular. Murmurs & Other Heart Sounds:Auscultation of the heart reveals- No Murmurs.  Abdomen Inspection:-Inspeection Normal. Palpation/Percussion:Note:No mass. Palpation and Percussion of the abdomen reveal- Non Tender, Non Distended + BS, no rebound or guarding.   Neurologic  Cranial Nerve exam:- CN III-XII intact(No nystagmus), symmetric smile. Strength:- 5/5 equal and symmetric strength both upper and lower extremities.    Lower ext- calfs symmetric, no edema and negative homans signs.     Assessment & Plan:   Patient Instructions  Asthma exacerbation Moderate to severe exacerbation with persistent wheezing and bronchial spasms. Negative D-dimer ruled out pulmonary embolism. Possible environmental trigger. Continued albuterol  use necessary. - Prescribe Medrol 4 mg with a 6-day taper (6, 5, 4, 3, 2, 1). - Prescribe Advair inhaler for twice daily use. - Refill albuterol  inhaler as needed.(As back up to use if needed) - Order chest x-ray to rule out infection. - Advise avoidance of hot, humid weather.  Elevated blood sugar Elevated blood sugar levels with potential increase due to steroid use. Family history of diabetes noted. - Order A1c test to assess three-month average blood sugar levels. - Advise  a low sugar diet while on Medrol.  Follow-up Follow-up needed to assess asthma resolution and potential pulmonologist referral. - Schedule follow-up appointment in one week to assess asthma control and determine need for pulmonologist referral.(Return sooner if needed)

## 2023-09-18 NOTE — Addendum Note (Signed)
 Addended by: DORINA DALLAS HERO on: 09/18/2023 09:38 PM   Modules accepted: Orders

## 2023-09-18 NOTE — Patient Instructions (Addendum)
 Asthma exacerbation(over improved from when in ED but still some symptoms) Moderate to severe exacerbation with persistent wheezing and bronchial spasms. Negative D-dimer ruled out pulmonary embolism. Possible environmental trigger. Continued albuterol  use necessary. - Prescribe Medrol 4 mg with a 6-day taper (6, 5, 4, 3, 2, 1). - Prescribe Advair inhaler for twice daily use. - Refill albuterol  inhaler as needed.(As back up to use if needed) - Order chest x-ray to rule out infection. - Advise avoidance of hot, humid weather.  Elevated blood sugar Elevated blood sugar levels with potential increase due to steroid use. Family history of diabetes noted. - Order A1c test to assess three-month average blood sugar levels. - Advise a low sugar diet while on Medrol.  Follow-up Follow-up needed to assess asthma resolution and potential pulmonologist referral. - Schedule follow-up appointment in one week to assess asthma control and determine need for pulmonologist referral.(Return sooner if needed)

## 2023-09-19 ENCOUNTER — Other Ambulatory Visit (HOSPITAL_BASED_OUTPATIENT_CLINIC_OR_DEPARTMENT_OTHER): Payer: Self-pay

## 2023-09-26 ENCOUNTER — Encounter (INDEPENDENT_AMBULATORY_CARE_PROVIDER_SITE_OTHER): Payer: Self-pay

## 2023-09-26 ENCOUNTER — Ambulatory Visit: Admitting: Medical

## 2023-09-26 VITALS — BP 124/88 | HR 84 | Temp 97.9°F | Resp 18 | Ht 71.0 in | Wt 234.0 lb

## 2023-09-26 DIAGNOSIS — Z7984 Long term (current) use of oral hypoglycemic drugs: Secondary | ICD-10-CM | POA: Diagnosis not present

## 2023-09-26 DIAGNOSIS — J4540 Moderate persistent asthma, uncomplicated: Secondary | ICD-10-CM

## 2023-09-26 DIAGNOSIS — E119 Type 2 diabetes mellitus without complications: Secondary | ICD-10-CM | POA: Diagnosis not present

## 2023-09-26 NOTE — Progress Notes (Signed)
 Subjective:    Patient ID: Dale Harper, male    DOB: [birth year], 48 y.o.   MRN: [id number]  HPI  Pt in for follow up on last visit.  Asthma exacerbation(over improved from when in ED but still some symptoms) Moderate to severe exacerbation with persistent wheezing and bronchial spasms. Negative D-dimer ruled out pulmonary embolism. Possible environmental trigger. Continued albuterol  use necessary. - Prescribe Medrol  4 mg with a 6-day taper (6, 5, 4, 3, 2, 1). - Prescribe Advair  inhaler for twice daily use. - Refill albuterol  inhaler as needed.(As back up to use if needed) - Order chest x-ray to rule out infection. - Advise avoidance of hot, humid weather.   Elevated blood sugar Elevated blood sugar levels with potential increase due to steroid use. Family history of diabetes noted. - Order A1c test to assess three-month average blood sugar levels. - Advise a low sugar diet while on Medrol .   Dale Harper is a 48 year old male with asthma who presents for follow-up after an asthma exacerbation.  He has experienced significant improvement in asthma symptoms following a recent exacerbation. Previously, he had persistent wheezing and coughing, which have now resolved by approximately 97% after using a tapered dose of Advair  inhaler and albuterol  as needed. No wheezing is present, and coughing is now rare and intermittent, occurring occasionally after eating. No nighttime coughing, and he reports sleeping well.  Occasional coughing after eating is associated with heartburn. He has experienced heartburn in the past and has made significant dietary changes to manage it. No current burning sensation or belching is reported. A recent chest x-ray indicated a retrocardiac hiatal hernia.  He has a history of elevated blood sugar levels, with a recent A1c of 6.9%. He recalls previous instances of elevated sugar levels, particularly during a past flu episode. He has started metformin ,  initially taking it twice daily but now adjusting to once daily due to gastrointestinal side effects such as nausea and loose stools. His average blood sugar is around 150 mg/dL.    Review of Systems  Constitutional:  Negative for chills, fatigue and fever.  HENT:  Negative for congestion, ear pain and sore throat.   Respiratory:  Positive for cough. Negative for chest tightness, wheezing and stridor.        Rare dry cogh.  Cardiovascular:  Negative for chest pain and palpitations.  Gastrointestinal:  Negative for abdominal pain, blood in stool, constipation, nausea and vomiting.       Rare reflux.  Genitourinary:  Negative for dysuria and flank pain.  Musculoskeletal:  Negative for back pain, joint swelling and neck pain.  Skin:  Negative for rash.  Neurological:  Negative for dizziness, weakness and light-headedness.  Hematological:  Negative for adenopathy.  Psychiatric/Behavioral:  Negative for behavioral problems, confusion and hallucinations. The patient is not nervous/anxious.     No past medical history on file.   Social History   Socioeconomic History   Marital status: Widowed    Spouse name: Not on file   Number of children: Not on file   Years of education: Not on file   Highest education level: Not on file  Occupational History   Not on file  Tobacco Use   Smoking status: Never   Smokeless tobacco: Never  Vaping Use   Vaping status: Never Used  Substance and Sexual Activity   Alcohol use: Yes    Alcohol/week: 1.0 standard drink of alcohol    Types: 1 Glasses of wine per  week    Comment: every 6 monhts.   Drug use: No   Sexual activity: Not on file  Other Topics Concern   Not on file  Social History Narrative   Not on file   Social Drivers of Health   Financial Resource Strain: Not on file  Food Insecurity: Not on file  Transportation Needs: Not on file  Physical Activity: Not on file  Stress: Not on file  Social Connections: Not on file  Intimate  Partner Violence: Not on file    No past surgical history on file.  No family history on file.  No Known Allergies  Current Outpatient Medications on File Prior to Visit  Medication Sig Dispense Refill   albuterol  (VENTOLIN  HFA) 108 (90 Base) MCG/ACT inhaler Inhale 2 puffs into the lungs every 6 (six) hours as needed. 6.7 g 0   benzonatate  (TESSALON ) 100 MG capsule Take 1 capsule (100 mg total) by mouth every 8 (eight) hours. 15 capsule 0   famotidine  (PEPCID ) 20 MG tablet Take 1 tablet (20 mg total) by mouth daily. 30 tablet 0   fluticasone -salmeterol (ADVAIR ) 100-50 MCG/ACT AEPB Inhale 1 puff into the lungs 2 (two) times daily. 60 each 3   metFORMIN  (GLUCOPHAGE ) 500 MG tablet Take 1 tablet (500 mg total) by mouth 2 (two) times daily with a meal. 180 tablet 3   methylPREDNISolone  (MEDROL ) 4 MG tablet Take as directed on package, 6-day taper. 21 tablet 0   naproxen  (NAPROSYN ) 500 MG tablet Take 1 tablet (500 mg total) by mouth 2 (two) times daily. 30 tablet 0   ondansetron  (ZOFRAN -ODT) 4 MG disintegrating tablet Take 1 tablet (4 mg total) by mouth every 8 (eight) hours as needed for nausea or vomiting. 20 tablet 0   No current facility-administered medications on file prior to visit.    BP 124/88   Pulse 84   Temp 97.9 F (36.6 C)   Resp 18   Ht 5' 11 (1.803 m)   Wt 234 lb (106.1 kg)   SpO2 98%   BMI 32.64 kg/m        Objective:   Physical Exam   General Mental Status- Alert. General Appearance- Not in acute distress.   Skin General: Color- Normal Color. Moisture- Normal Moisture.  Neck Carotid Arteries- Normal color. Moisture- Normal Moisture. No carotid bruits. No JVD.  Chest and Lung Exam Auscultation: Breath Sounds:-CTA  Cardiovascular Auscultation:Rythm- RRR Murmurs & Other Heart Sounds:Auscultation of the heart reveals- No Murmurs.  Abdomen Inspection:-Inspeection Normal. Palpation/Percussion:Note:No mass. Palpation and Percussion of the abdomen  reveal- Non Tender, Non Distended + BS, no rebound or guarding.   Neurologic Cranial Nerve exam:- CN III-XII intact(No nystagmus), symmetric smile. Strength:- 5/5 equal and symmetric strength both upper and lower extremities.      Assessment & Plan:   Patient Instructions  Asthma exacerbation Significant improvement with treatment, now 97% better per pt with occasional dry cough possibly related to heartburn. - Continue Advair  inhaler, one inhalation twice daily for 1-2 months. - Use albuterol  as needed. - Consider discontinuing Advair  in the fall if symptoms remain controlled.  Hiatal hernia with potential gastroesophageal reflux disease (GERD) Retrocardiac hiatal hernia on xray may contribute to heartburn and cough after eating. - Advise dietary modifications to avoid heartburn triggers. - Use famotidine / Pepcid  for occasional heartburn. - Consider omeprazole if symptoms persist.  Type 2 Diabetes Mellitus A1c 6.9, indicating diabetes. Started metformin  with some gastrointestinal side effects, advised gradual dose increase. - Continue metformin , one tablet  daily for first two weeks, then increase to twice daily. - Refer to diabetic education for dietary and lifestyle guidance. - Encourage low sugar diet and regular exercise.  Follow up 3 months or sooner if needed. on follow up do wellness exam with diabetic check.   Sandy Blouch, PA-C

## 2023-09-26 NOTE — Patient Instructions (Signed)
 Asthma exacerbation Significant improvement with treatment, now 97% better per pt with occasional dry cough possibly related to heartburn. - Continue Advair  inhaler, one inhalation twice daily for 1-2 months. - Use albuterol  as needed. - Consider discontinuing Advair  in the fall if symptoms remain controlled.  Hiatal hernia with potential gastroesophageal reflux disease (GERD) Retrocardiac hiatal hernia on xray may contribute to heartburn and cough after eating. - Advise dietary modifications to avoid heartburn triggers. - Use famotidine / Pepcid  for occasional heartburn. - Consider omeprazole if symptoms persist.  Type 2 Diabetes Mellitus A1c 6.9, indicating diabetes. Started metformin  with some gastrointestinal side effects, advised gradual dose increase. - Continue metformin , one tablet daily for first two weeks, then increase to twice daily. - Refer to diabetic education for dietary and lifestyle guidance. - Encourage low sugar diet and regular exercise.  Follow up 3 months or sooner if needed. on follow up do wellness exam with diabetic check.

## 2023-11-20 ENCOUNTER — Encounter: Payer: Self-pay | Admitting: Medical

## 2023-11-20 ENCOUNTER — Ambulatory Visit (INDEPENDENT_AMBULATORY_CARE_PROVIDER_SITE_OTHER): Admitting: Medical

## 2023-11-20 VITALS — BP 130/80 | HR 74 | Resp 18 | Ht 71.0 in | Wt 229.4 lb

## 2023-11-20 DIAGNOSIS — Z125 Encounter for screening for malignant neoplasm of prostate: Secondary | ICD-10-CM | POA: Diagnosis not present

## 2023-11-20 DIAGNOSIS — Z1159 Encounter for screening for other viral diseases: Secondary | ICD-10-CM | POA: Diagnosis not present

## 2023-11-20 DIAGNOSIS — Z0184 Encounter for antibody response examination: Secondary | ICD-10-CM

## 2023-11-20 DIAGNOSIS — Z23 Encounter for immunization: Secondary | ICD-10-CM

## 2023-11-20 DIAGNOSIS — E119 Type 2 diabetes mellitus without complications: Secondary | ICD-10-CM

## 2023-11-20 DIAGNOSIS — Z0000 Encounter for general adult medical examination without abnormal findings: Secondary | ICD-10-CM

## 2023-11-20 DIAGNOSIS — Z1211 Encounter for screening for malignant neoplasm of colon: Secondary | ICD-10-CM

## 2023-11-20 LAB — CBC WITH DIFFERENTIAL/PLATELET
Basophils Absolute: 0 K/uL (ref 0.0–0.1)
Basophils Relative: 0.3 % (ref 0.0–3.0)
Eosinophils Absolute: 0.1 K/uL (ref 0.0–0.7)
Eosinophils Relative: 2.4 % (ref 0.0–5.0)
HCT: 46.1 % (ref 39.0–52.0)
Hemoglobin: 15.2 g/dL (ref 13.0–17.0)
Lymphocytes Relative: 31.5 % (ref 12.0–46.0)
Lymphs Abs: 1.3 K/uL (ref 0.7–4.0)
MCHC: 33 g/dL (ref 30.0–36.0)
MCV: 89.6 fl (ref 78.0–100.0)
Monocytes Absolute: 0.3 K/uL (ref 0.1–1.0)
Monocytes Relative: 6.8 % (ref 3.0–12.0)
Neutro Abs: 2.5 K/uL (ref 1.4–7.7)
Neutrophils Relative %: 59 % (ref 43.0–77.0)
Platelets: 268 K/uL (ref 150.0–400.0)
RBC: 5.15 Mil/uL (ref 4.22–5.81)
RDW: 14.8 % (ref 11.5–15.5)
WBC: 4.3 K/uL (ref 4.0–10.5)

## 2023-11-20 LAB — MICROALBUMIN / CREATININE URINE RATIO
Creatinine,U: 161.7 mg/dL
Microalb Creat Ratio: 5.2 mg/g (ref 0.0–30.0)
Microalb, Ur: 0.8 mg/dL (ref 0.0–1.9)

## 2023-11-20 LAB — COMPREHENSIVE METABOLIC PANEL WITH GFR
ALT: 34 U/L (ref 0–53)
AST: 60 U/L — ABNORMAL HIGH (ref 0–37)
Albumin: 4.4 g/dL (ref 3.5–5.2)
Alkaline Phosphatase: 57 U/L (ref 39–117)
BUN: 13 mg/dL (ref 6–23)
CO2: 28 meq/L (ref 19–32)
Calcium: 9.4 mg/dL (ref 8.4–10.5)
Chloride: 102 meq/L (ref 96–112)
Creatinine, Ser: 1.07 mg/dL (ref 0.40–1.50)
GFR: 83.09 mL/min (ref >60.00)
Glucose, Bld: 119 mg/dL — ABNORMAL HIGH (ref 70–99)
Potassium: 4.4 meq/L (ref 3.5–5.1)
Sodium: 140 meq/L (ref 135–145)
Total Bilirubin: 1.1 mg/dL (ref 0.2–1.2)
Total Protein: 6.8 g/dL (ref 6.0–8.3)

## 2023-11-20 LAB — LIPID PANEL
Cholesterol: 126 mg/dL (ref 0–200)
HDL: 37.2 mg/dL — ABNORMAL LOW (ref >39.00)
LDL Cholesterol: 75 mg/dL (ref 0–99)
NonHDL: 88.86
Total CHOL/HDL Ratio: 3
Triglycerides: 69 mg/dL (ref 0.0–149.0)
VLDL: 13.8 mg/dL (ref 0.0–40.0)

## 2023-11-20 LAB — PSA: PSA: 0.39 ng/mL (ref 0.10–4.00)

## 2023-11-20 NOTE — Progress Notes (Signed)
 Subjective:    Patient ID: Dale Harper, male    DOB: 1975-08-29, 48 y.o.   MRN: [id number]  HPI  Pt here for wellness exam. Pt is fasting today.   Pt works for [employer]. Walking now 3-4 days a week. Pt states eating near zero sugar diet. Non smoker. No alcohol use for a year.  Rare soda.   Pt gets flu vaccines thru his work.   Pt will get hep b surface antibody test today.  Hep c antibody test as well.  Pcv 20 vaccine today.  Placing colonoscopy referral today. More than 10 years ago had one done. Done due to bright red blood when he wiped back then. Study done was negative.   Pt last A1c 2 months ago was 6.9. Pt did also start metformin  that I prescribed him.    BP 130/80 on recheck.     Review of Systems  Constitutional:  Negative for chills, fatigue and fever.  HENT:  Negative for congestion, ear discharge, postnasal drip and sinus pressure.   Respiratory:  Negative for cough, chest tightness, shortness of breath and wheezing.   Cardiovascular:  Negative for chest pain and palpitations.  Gastrointestinal:  Negative for abdominal pain, constipation and nausea.  Musculoskeletal:  Negative for back pain, joint swelling, myalgias and neck stiffness.  Skin:  Negative for rash.  Neurological:  Negative for dizziness, speech difficulty, numbness and headaches.  Hematological:  Negative for adenopathy. Does not bruise/bleed easily.  Psychiatric/Behavioral:  Negative for behavioral problems, dysphoric mood, sleep disturbance and suicidal ideas. The patient is not nervous/anxious.        No report of.     No past medical history on file.   Social History   Socioeconomic History   Marital status: Widowed    Spouse name: Not on file   Number of children: Not on file   Years of education: Not on file   Highest education level: Some college, no degree  Occupational History   Not on file  Tobacco Use   Smoking status: Never    Smokeless tobacco: Never  Vaping Use   Vaping status: Never Used  Substance and Sexual Activity   Alcohol use: Yes    Alcohol/week: 1.0 standard drink of alcohol    Types: 1 Glasses of wine per week    Comment: every 6 monhts.   Drug use: No   Sexual activity: Not on file  Other Topics Concern   Not on file  Social History Narrative   Not on file   Social Drivers of Health   Financial Resource Strain: Low Risk  (11/19/2023)   Overall Financial Resource Strain (CARDIA)    Difficulty of Paying Living Expenses: Not very hard  Food Insecurity: No Food Insecurity (11/19/2023)   Hunger Vital Sign    Worried About Running Out of Food in the Last Year: Never true    Ran Out of Food in the Last Year: Never true  Transportation Needs: No Transportation Needs (11/19/2023)   PRAPARE - Administrator, Civil Service (Medical): No    Lack of Transportation (Non-Medical): No  Physical Activity: Insufficiently Active (11/19/2023)   Exercise Vital Sign    Days of Exercise per Week: 3 days    Minutes of Exercise per Session: 20 min  Stress: No Stress Concern Present (11/19/2023)   [employer] of Occupational Health - Occupational Stress Questionnaire    Feeling of Stress: Not at all  Social  Connections: Moderately Isolated (11/19/2023)   Social Connection and Isolation Panel    Frequency of Communication with Friends and Family: More than three times a week    Frequency of Social Gatherings with Friends and Family: Never    Attends Religious Services: More than 4 times per year    Active Member of [employer] or Organizations: No    Attends Banker Meetings: Not on file    Marital Status: Widowed  Intimate Partner Violence: Not on file    No past surgical history on file.  No family history on file.  No Known Allergies  Current Outpatient Medications on File Prior to Visit  Medication Sig Dispense Refill   albuterol  (VENTOLIN  HFA) 108 (90 Base) MCG/ACT inhaler  Inhale 2 puffs into the lungs every 6 (six) hours as needed. 6.7 g 0   benzonatate  (TESSALON ) 100 MG capsule Take 1 capsule (100 mg total) by mouth every 8 (eight) hours. 15 capsule 0   famotidine  (PEPCID ) 20 MG tablet Take 1 tablet (20 mg total) by mouth daily. 30 tablet 0   fluticasone -salmeterol (ADVAIR ) 100-50 MCG/ACT AEPB Inhale 1 puff into the lungs 2 (two) times daily. 60 each 3   metFORMIN  (GLUCOPHAGE ) 500 MG tablet Take 1 tablet (500 mg total) by mouth 2 (two) times daily with a meal. 180 tablet 3   methylPREDNISolone  (MEDROL ) 4 MG tablet Take as directed on package, 6-day taper. 21 tablet 0   naproxen  (NAPROSYN ) 500 MG tablet Take 1 tablet (500 mg total) by mouth 2 (two) times daily. 30 tablet 0   ondansetron  (ZOFRAN -ODT) 4 MG disintegrating tablet Take 1 tablet (4 mg total) by mouth every 8 (eight) hours as needed for nausea or vomiting. 20 tablet 0   No current facility-administered medications on file prior to visit.    BP 130/80   Pulse 74   Resp 18   Ht 5' 11 (1.803 m)   Wt 229 lb 6.4 oz (104.1 kg)   SpO2 98%   BMI 31.99 kg/m        Objective:   Physical Exam  General Mental Status- Alert. General Appearance- Not in acute distress.   Skin General: Color- Normal Color. Moisture- Normal Moisture.  Neck No JVD.  Chest and Lung Exam Auscultation: Breath Sounds: CTA  Cardiovascular Auscultation:Rythm- RRR Murmurs & Other Heart Sounds:Auscultation of the heart reveals- No Murmurs.  Abdomen Inspection:-Inspeection Normal. Palpation/Percussion:Note:No mass. Palpation and Percussion of the abdomen reveal- Non Tender, Non Distended + BS, no rebound or guarding.   Neurologic Cranial Nerve exam:- CN III-XII intact(No nystagmus), symmetric smile. Strength:- 5/5 equal and symmetric strength both upper and lower extremities.       Assessment & Plan:   Patient Instructions  For you wellness exam today I have ordered cbc, cmp, psa, hep b surface antibody, hep c  antibody and  lipid panel.  Pcv 20 vaccine today  Referral for screening colonoscopy placed.  Recommend exercise and healthy diet.  We will let you know lab results as they come in.  Follow up date appointment will be determined after lab review.      Nykira Reddix, PA-C

## 2023-11-20 NOTE — Patient Instructions (Addendum)
 For you wellness exam today I have ordered cbc, cmp, psa, hep b surface antibody, hep c antibody and  lipid panel.  Pcv 20 vaccine today  Referral for screening colonoscopy placed.  Recommend exercise and healthy diet.  We will let you know lab results as they come in.  Follow up date appointment will be determined after lab review.     Preventive Care 36-47 Years Old, Male Preventive care refers to lifestyle choices and visits with your health care provider that can promote health and wellness. Preventive care visits are also called wellness exams. What can I expect for my preventive care visit? Counseling During your preventive care visit, your health care provider may ask about your: Medical history, including: Past medical problems. Family medical history. Current health, including: Emotional well-being. Home life and relationship well-being. Sexual activity. Lifestyle, including: Alcohol, nicotine or tobacco, and drug use. Access to firearms. Diet, exercise, and sleep habits. Safety issues such as seatbelt and bike helmet use. Sunscreen use. Work and work Astronomer. Physical exam Your health care provider will check your: Height and weight. These may be used to calculate your BMI (body mass index). BMI is a measurement that tells if you are at a healthy weight. Waist circumference. This measures the distance around your waistline. This measurement also tells if you are at a healthy weight and may help predict your risk of certain diseases, such as type 2 diabetes and high blood pressure. Heart rate and blood pressure. Body temperature. Skin for abnormal spots. What immunizations do I need?  Vaccines are usually given at various ages, according to a schedule. Your health care provider will recommend vaccines for you based on your age, medical history, and lifestyle or other factors, such as travel or where you work. What tests do I need? Screening Your health care  provider may recommend screening tests for certain conditions. This may include: Lipid and cholesterol levels. Diabetes screening. This is done by checking your blood sugar (glucose) after you have not eaten for a while (fasting). Hepatitis B test. Hepatitis C test. HIV (human immunodeficiency virus) test. STI (sexually transmitted infection) testing, if you are at risk. Lung cancer screening. Prostate cancer screening. Colorectal cancer screening. Talk with your health care provider about your test results, treatment options, and if necessary, the need for more tests. Follow these instructions at home: Eating and drinking  Eat a diet that includes fresh fruits and vegetables, whole grains, lean protein, and low-fat dairy products. Take vitamin and mineral supplements as recommended by your health care provider. Do not drink alcohol if your health care provider tells you not to drink. If you drink alcohol: Limit how much you have to 0-2 drinks a day. Know how much alcohol is in your drink. In the [place] one drink equals one 12 oz bottle of beer (355 mL), one 5 oz glass of wine (148 mL), or one 1 oz glass of hard liquor (44 mL). Lifestyle Brush your teeth every morning and night with fluoride toothpaste. Floss one time each day. Exercise for at least 30 minutes 5 or more days each week. Do not use any products that contain nicotine or tobacco. These products include cigarettes, chewing tobacco, and vaping devices, such as e-cigarettes. If you need help quitting, ask your health care provider. Do not use drugs. If you are sexually active, practice safe sex. Use a condom or other form of protection to prevent STIs. Take aspirin only as told by your health care provider. Make sure  that you understand how much to take and what form to take. Work with your health care provider to find out whether it is safe and beneficial for you to take aspirin daily. Find healthy ways to manage stress, such  as: Meditation, yoga, or listening to music. Journaling. Talking to a trusted person. Spending time with friends and family. Minimize exposure to UV radiation to reduce your risk of skin cancer. Safety Always wear your seat belt while driving or riding in a vehicle. Do not drive: If you have been drinking alcohol. Do not ride with someone who has been drinking. When you are tired or distracted. While texting. If you have been using any mind-altering substances or drugs. Wear a helmet and other protective equipment during sports activities. If you have firearms in your house, make sure you follow all gun safety procedures. What's next? Go to your health care provider once a year for an annual wellness visit. Ask your health care provider how often you should have your eyes and teeth checked. Stay up to date on all vaccines. This information is not intended to replace advice given to you by your health care provider. Make sure you discuss any questions you have with your health care provider. Document Revised: 09/08/2020 Document Reviewed: 09/08/2020 Elsevier Patient Education  2024 [employer].

## 2023-11-20 NOTE — Addendum Note (Signed)
 Addended by: DORLENE CHIQUITA RAMAN on: 11/20/2023 10:00 AM   Modules accepted: Orders

## 2023-11-21 ENCOUNTER — Ambulatory Visit: Payer: Self-pay | Admitting: Medical

## 2023-11-21 LAB — HEPATITIS C ANTIBODY: Hepatitis C Ab: NONREACTIVE

## 2023-11-21 LAB — HEPATITIS B SURFACE ANTIBODY, QUANTITATIVE: Hep B S AB Quant (Post): 634 m[IU]/mL (ref >=10)

## 2023-11-21 NOTE — Addendum Note (Signed)
 Addended by: DORINA DALLAS HERO on: 11/21/2023 09:53 PM   Modules accepted: Orders

## 2024-01-21 ENCOUNTER — Ambulatory Visit: Payer: Self-pay | Admitting: Medical

## 2024-01-21 ENCOUNTER — Ambulatory Visit: Admitting: Medical

## 2024-01-21 VITALS — BP 118/86 | HR 65 | Temp 97.8°F | Resp 15 | Ht 71.0 in | Wt 228.0 lb

## 2024-01-21 DIAGNOSIS — Z7984 Long term (current) use of oral hypoglycemic drugs: Secondary | ICD-10-CM | POA: Diagnosis not present

## 2024-01-21 DIAGNOSIS — E119 Type 2 diabetes mellitus without complications: Secondary | ICD-10-CM | POA: Diagnosis not present

## 2024-01-21 LAB — HEMOGLOBIN A1C: Hgb A1c MFr Bld: 6.4 % (ref 4.6–6.5)

## 2024-01-21 MED ORDER — METFORMIN HCL 500 MG PO TABS
500.0000 mg | ORAL_TABLET | Freq: Two times a day (BID) | ORAL | 3 refills | Status: AC
  Filled 2024-01-21 – 2024-03-13 (×2): qty 180, 90d supply, fill #0

## 2024-01-21 NOTE — Progress Notes (Signed)
 Subjective:    Patient ID: Dale Harper, male    DOB: 07-Feb-1976, 48 y.o.   MRN: [id number]  HPI  Dale Harper is a 48 year old male with diabetes who presents for follow-up of his diabetes management.  He has a history of diabetes and his last hemoglobin A1c was 6.9, measured in the summer, with an average blood sugar level of approximately 150 mg/dL at that time. Since then, he has started taking metformin  and adhering to a low sugar diet.  He engages in regular physical activity, specifically walking almost every day except for weekends, and has lost about ten pounds since his last visit. He did not eat breakfast today, but fasting was not necessary as his cholesterol was checked two months ago. At that time, his cholesterol levels were generally good, except for low high-density lipoprotein (HDL) cholesterol. A urine microalbumin test was also conducted and returned normal results.       Review of Systems  Constitutional:  Negative for chills, fatigue and fever.  Respiratory:  Negative for cough, chest tightness, wheezing and stridor.   Cardiovascular:  Negative for chest pain and palpitations.  Gastrointestinal:  Negative for abdominal pain, blood in stool, constipation, nausea and vomiting.  Genitourinary:  Negative for dysuria and flank pain.  Musculoskeletal:  Negative for back pain, joint swelling and neck pain.  Skin:  Negative for rash.  Neurological:  Negative for dizziness, weakness and light-headedness.  Hematological:  Negative for adenopathy.  Psychiatric/Behavioral:  Negative for behavioral problems, confusion and hallucinations. The patient is not nervous/anxious.     No past medical history on file.   Social History   Socioeconomic History   Marital status: Widowed    Spouse name: Not on file   Number of children: Not on file   Years of education: Not on file   Highest education level: Some college, no degree  Occupational History   Not on file   Tobacco Use   Smoking status: Never   Smokeless tobacco: Never  Vaping Use   Vaping status: Never Used  Substance and Sexual Activity   Alcohol use: Yes    Alcohol/week: 1.0 standard drink of alcohol    Types: 1 Glasses of wine per week    Comment: every 6 monhts.   Drug use: No   Sexual activity: Not on file  Other Topics Concern   Not on file  Social History Narrative   Not on file   Social Drivers of Health   Financial Resource Strain: Low Risk  (01/20/2024)   Overall Financial Resource Strain (CARDIA)    Difficulty of Paying Living Expenses: Not hard at all  Food Insecurity: No Food Insecurity (01/20/2024)   Hunger Vital Sign    Worried About Running Out of Food in the Last Year: Never true    Ran Out of Food in the Last Year: Never true  Transportation Needs: No Transportation Needs (01/20/2024)   PRAPARE - Administrator, Civil Service (Medical): No    Lack of Transportation (Non-Medical): No  Physical Activity: Sufficiently Active (01/20/2024)   Exercise Vital Sign    Days of Exercise per Week: 5 days    Minutes of Exercise per Session: 30 min  Recent Concern: Physical Activity - Insufficiently Active (11/19/2023)   Exercise Vital Sign    Days of Exercise per Week: 3 days    Minutes of Exercise per Session: 20 min  Stress: No Stress Concern Present (01/20/2024)   [employer] - Occupational Stress Questionnaire    Feeling of Stress: Not at all  Social Connections: Moderately Isolated (01/20/2024)   Social Connection and Isolation Panel    Frequency of Communication with Friends and Family: More than three times a week    Frequency of Social Gatherings with Friends and Family: More than three times a week    Attends Religious Services: More than 4 times per year    Active Member of [employer] or Organizations: No    Attends Banker Meetings: Not on file    Marital Status: Widowed  Intimate Partner Violence: Not on  file    No past surgical history on file.  No family history on file.  No Known Allergies  Current Outpatient Medications on File Prior to Visit  Medication Sig Dispense Refill   albuterol  (VENTOLIN  HFA) 108 (90 Base) MCG/ACT inhaler Inhale 2 puffs into the lungs every 6 (six) hours as needed. 6.7 g 0   benzonatate  (TESSALON ) 100 MG capsule Take 1 capsule (100 mg total) by mouth every 8 (eight) hours. 15 capsule 0   famotidine  (PEPCID ) 20 MG tablet Take 1 tablet (20 mg total) by mouth daily. 30 tablet 0   fluticasone -salmeterol (ADVAIR ) 100-50 MCG/ACT AEPB Inhale 1 puff into the lungs 2 (two) times daily. 60 each 3   metFORMIN  (GLUCOPHAGE ) 500 MG tablet Take 1 tablet (500 mg total) by mouth 2 (two) times daily with a meal. 180 tablet 3   methylPREDNISolone  (MEDROL ) 4 MG tablet Take as directed on package, 6-day taper. 21 tablet 0   naproxen  (NAPROSYN ) 500 MG tablet Take 1 tablet (500 mg total) by mouth 2 (two) times daily. 30 tablet 0   ondansetron  (ZOFRAN -ODT) 4 MG disintegrating tablet Take 1 tablet (4 mg total) by mouth every 8 (eight) hours as needed for nausea or vomiting. 20 tablet 0   No current facility-administered medications on file prior to visit.    BP 118/86   Pulse 65   Temp 97.8 F (36.6 C) (Oral)   Resp 15   Ht 5' 11 (1.803 m)   Wt 228 lb (103.4 kg)   SpO2 97%   BMI 31.80 kg/m        Objective:   Physical Exam  General Mental Status- Alert. General Appearance- Not in acute distress.   Skin General: Color- Normal Color. Moisture- Normal Moisture.  Neck No JVD.  Chest and Lung Exam Auscultation: Breath Sounds:-Normal.  Cardiovascular Auscultation:Rythm- Regular. Murmurs & Other Heart Sounds:Auscultation of the heart reveals- No Murmurs.  Abdomen Inspection:-Inspeection Normal. Palpation/Percussion:Note:No mass. Palpation and Percussion of the abdomen reveal- Non Tender, Non Distended + BS, no rebound or guarding.   Neurologic Cranial Nerve  exam:- CN III-XII intact(No nystagmus), symmetric smile. Strength:- 5/5 equal and symmetric strength both upper and lower extremities.       Assessment & Plan:   Type 2 Diabetes Mellitus Type 2 diabetes with A1c of 6.9% and average glucose 150 mg/dL. Managed with metformin , diet, and exercise, resulting in 10-pound weight loss. Due for A1c and kidney function tests. - Order A1c and kidney function tests. - Continue metformin . - Continue low sugar diet. - Continue regular exercise.  Low HDL Cholesterol Low HDL cholesterol identified. Exercise recommended to increase HDL levels. - Encourage regular exercise. Continue low cholesterol diet as well.  Follow up date 3-6 months depending on lab results.  Dwyne Hasegawa, PA-C

## 2024-01-21 NOTE — Addendum Note (Signed)
 Addended by: DORINA DALLAS HERO on: 01/21/2024 08:27 PM   Modules accepted: Orders

## 2024-01-21 NOTE — Patient Instructions (Signed)
 Type 2 Diabetes Mellitus Type 2 diabetes with A1c of 6.9% and average glucose 150 mg/dL. Managed with metformin , diet, and exercise, resulting in 10-pound weight loss. Due for A1c and kidney function tests. - Order A1c and kidney function tests. - Continue metformin . - Continue low sugar diet. - Continue regular exercise.  Low HDL Cholesterol Low HDL cholesterol identified. Exercise recommended to increase HDL levels. - Encourage regular exercise. Continue low cholesterol diet as well.  Follow up date 3-6 months depending on lab results.

## 2024-01-22 ENCOUNTER — Other Ambulatory Visit (HOSPITAL_BASED_OUTPATIENT_CLINIC_OR_DEPARTMENT_OTHER): Payer: Self-pay

## 2024-01-22 LAB — COMPLETE METABOLIC PANEL WITHOUT GFR
AG Ratio: 2 (calc) (ref 1.0–2.5)
ALT: 21 U/L (ref 9–46)
AST: 22 U/L (ref 10–40)
Albumin: 4.3 g/dL (ref 3.6–5.1)
Alkaline phosphatase (APISO): 55 U/L (ref 36–130)
BUN: 14 mg/dL (ref 7–25)
CO2: 24 mmol/L (ref 20–32)
Calcium: 9.2 mg/dL (ref 8.6–10.3)
Chloride: 105 mmol/L (ref 98–110)
Creat: 1 mg/dL (ref 0.60–1.29)
Globulin: 2.2 g/dL (ref 1.9–3.7)
Glucose, Bld: 104 mg/dL — ABNORMAL HIGH (ref 65–99)
Potassium: 4.1 mmol/L (ref 3.5–5.3)
Sodium: 140 mmol/L (ref 135–146)
Total Bilirubin: 0.7 mg/dL (ref 0.2–1.2)
Total Protein: 6.5 g/dL (ref 6.1–8.1)

## 2024-02-01 ENCOUNTER — Other Ambulatory Visit (HOSPITAL_BASED_OUTPATIENT_CLINIC_OR_DEPARTMENT_OTHER): Payer: Self-pay

## 2024-03-13 ENCOUNTER — Other Ambulatory Visit: Payer: Self-pay

## 2024-11-21 ENCOUNTER — Encounter: Admitting: Medical
